# Patient Record
Sex: Male | Born: 1950 | Race: White | Hispanic: No | Marital: Married | Smoking: Never smoker
Health system: Southern US, Community
[De-identification: ages and names within clinical notes are randomized; demographics above are authoritative.]

## PROBLEM LIST (undated history)

## (undated) DIAGNOSIS — M1990 Unspecified osteoarthritis, unspecified site: Secondary | ICD-10-CM

## (undated) DIAGNOSIS — Z87442 Personal history of urinary calculi: Secondary | ICD-10-CM

## (undated) DIAGNOSIS — E785 Hyperlipidemia, unspecified: Secondary | ICD-10-CM

## (undated) DIAGNOSIS — Z8673 Personal history of transient ischemic attack (TIA), and cerebral infarction without residual deficits: Secondary | ICD-10-CM

## (undated) HISTORY — PX: KNEE SURGERY: SHX244

## (undated) HISTORY — DX: Personal history of urinary calculi: Z87.442

## (undated) HISTORY — DX: Personal history of transient ischemic attack (TIA), and cerebral infarction without residual deficits: Z86.73

## (undated) HISTORY — DX: Hyperlipidemia, unspecified: E78.5

## (undated) HISTORY — DX: Unspecified osteoarthritis, unspecified site: M19.90

## (undated) HISTORY — PX: ORTHOPEDIC SURGERY: SHX850

---

## 1997-11-07 ENCOUNTER — Ambulatory Visit (HOSPITAL_BASED_OUTPATIENT_CLINIC_OR_DEPARTMENT_OTHER): Admission: RE | Admit: 1997-11-07 | Discharge: 1997-11-07 | Payer: Self-pay | Admitting: General Surgery

## 1997-11-19 ENCOUNTER — Emergency Department (HOSPITAL_COMMUNITY): Admission: EM | Admit: 1997-11-19 | Discharge: 1997-11-19 | Payer: Self-pay | Admitting: Emergency Medicine

## 1997-12-19 ENCOUNTER — Emergency Department (HOSPITAL_COMMUNITY): Admission: EM | Admit: 1997-12-19 | Discharge: 1997-12-19 | Payer: Self-pay | Admitting: Emergency Medicine

## 1998-08-25 ENCOUNTER — Emergency Department (HOSPITAL_COMMUNITY): Admission: EM | Admit: 1998-08-25 | Discharge: 1998-08-25 | Payer: Self-pay | Admitting: *Deleted

## 1998-08-26 ENCOUNTER — Emergency Department (HOSPITAL_COMMUNITY): Admission: EM | Admit: 1998-08-26 | Discharge: 1998-08-26 | Payer: Self-pay | Admitting: *Deleted

## 1998-08-26 ENCOUNTER — Encounter: Payer: Self-pay | Admitting: *Deleted

## 1998-12-26 ENCOUNTER — Ambulatory Visit (HOSPITAL_BASED_OUTPATIENT_CLINIC_OR_DEPARTMENT_OTHER): Admission: RE | Admit: 1998-12-26 | Discharge: 1998-12-26 | Payer: Self-pay | Admitting: Orthopedic Surgery

## 1999-07-08 HISTORY — PX: LEFT HEART CATH: SHX5946

## 1999-07-11 ENCOUNTER — Ambulatory Visit (HOSPITAL_BASED_OUTPATIENT_CLINIC_OR_DEPARTMENT_OTHER): Admission: RE | Admit: 1999-07-11 | Discharge: 1999-07-11 | Payer: Self-pay | Admitting: Orthopedic Surgery

## 2000-01-16 ENCOUNTER — Encounter: Payer: Self-pay | Admitting: Emergency Medicine

## 2000-01-16 ENCOUNTER — Observation Stay (HOSPITAL_COMMUNITY): Admission: EM | Admit: 2000-01-16 | Discharge: 2000-01-17 | Payer: Self-pay | Admitting: Emergency Medicine

## 2004-11-26 ENCOUNTER — Encounter: Admission: RE | Admit: 2004-11-26 | Discharge: 2004-11-26 | Payer: Self-pay

## 2009-07-07 DIAGNOSIS — Z8673 Personal history of transient ischemic attack (TIA), and cerebral infarction without residual deficits: Secondary | ICD-10-CM

## 2009-07-07 HISTORY — DX: Personal history of transient ischemic attack (TIA), and cerebral infarction without residual deficits: Z86.73

## 2009-08-02 ENCOUNTER — Encounter (INDEPENDENT_AMBULATORY_CARE_PROVIDER_SITE_OTHER): Payer: Self-pay | Admitting: *Deleted

## 2009-08-03 ENCOUNTER — Encounter: Admission: RE | Admit: 2009-08-03 | Discharge: 2009-08-03 | Payer: Self-pay | Admitting: Family Medicine

## 2009-08-14 ENCOUNTER — Encounter (INDEPENDENT_AMBULATORY_CARE_PROVIDER_SITE_OTHER): Payer: Self-pay | Admitting: *Deleted

## 2009-08-15 ENCOUNTER — Ambulatory Visit: Payer: Self-pay | Admitting: Gastroenterology

## 2009-09-07 ENCOUNTER — Telehealth: Payer: Self-pay | Admitting: Gastroenterology

## 2009-10-11 ENCOUNTER — Ambulatory Visit: Payer: Self-pay | Admitting: Gastroenterology

## 2009-10-17 ENCOUNTER — Telehealth: Payer: Self-pay | Admitting: Gastroenterology

## 2009-10-25 ENCOUNTER — Ambulatory Visit (HOSPITAL_COMMUNITY): Admission: RE | Admit: 2009-10-25 | Discharge: 2009-10-25 | Payer: Self-pay | Admitting: Family Medicine

## 2010-07-12 ENCOUNTER — Ambulatory Visit (HOSPITAL_COMMUNITY)
Admission: RE | Admit: 2010-07-12 | Discharge: 2010-07-12 | Payer: Self-pay | Source: Home / Self Care | Attending: Family Medicine | Admitting: Family Medicine

## 2010-07-28 ENCOUNTER — Encounter: Payer: Self-pay | Admitting: Family Medicine

## 2010-08-08 NOTE — Letter (Signed)
Summary: [facility] Instructions  [facility]  [address] [place], [place] [address]   Phone: [phone]  Fax: [phone]       Phillip Jackson    Nov 24, 61 1952    MRN: [id number]        Procedure Day /Date: 09/10/09  Wednesday     Arrival Time:  10:30am      Procedure Time: 11:30am     Location of Procedure:                    _x _  [facility] (4th Floor)                        PREPARATION FOR COLONOSCOPY WITH MOVIPREP   Starting 5 days prior to your procedure 09/05/09 do not eat nuts, seeds, popcorn, corn, beans, peas,  salads, or any raw vegetables.  Do not take any fiber supplements (e.g. Metamucil, Citrucel, and Benefiber).  THE DAY BEFORE YOUR PROCEDURE         DATE:  09/09/09   DAY:  Sunday  1.  Drink clear liquids the entire day-NO SOLID FOOD  2.  Do not drink anything colored red or purple.  Avoid juices with pulp.  No orange juice.  3.  Drink at least 64 oz. (8 glasses) of fluid/clear liquids during the day to prevent dehydration and help the prep work efficiently.  CLEAR LIQUIDS INCLUDE: Water Jello Ice Popsicles Tea (sugar ok, no milk/cream) Powdered fruit flavored drinks Coffee (sugar ok, no milk/cream) Gatorade Juice: apple, white grape, white cranberry  Lemonade Clear bullion, consomm, broth Carbonated beverages (any kind) Strained chicken noodle soup Hard Candy                             4.  In the morning, mix first dose of MoviPrep solution:    Empty 1 Pouch A and 1 Pouch B into the disposable container    Add lukewarm drinking water to the top line of the container. Mix to dissolve    Refrigerate (mixed solution should be used within 24 hrs)  5.  Begin drinking the prep at 5:00 p.m. The MoviPrep container is divided by 4 marks.   Every 15 minutes drink the solution down to the next mark (approximately 8 oz) until the full liter is complete.   6.  Follow completed prep with 16 oz of clear liquid of your choice  (Nothing red or purple).  Continue to drink clear liquids until bedtime.  7.  Before going to bed, mix second dose of MoviPrep solution:    Empty 1 Pouch A and 1 Pouch B into the disposable container    Add lukewarm drinking water to the top line of the container. Mix to dissolve    Refrigerate  THE DAY OF YOUR PROCEDURE      DATE:  09/10/09  DAY:  Monday  Beginning at  6:30 a.m. (5 hours before procedure):         1. Every 15 minutes, drink the solution down to the next mark (approx 8 oz) until the full liter is complete.  2. Follow completed prep with 16 oz. of clear liquid of your choice.    3. You may drink clear liquids until  9:30am (2 HOURS BEFORE PROCEDURE).   MEDICATION INSTRUCTIONS  Unless otherwise instructed, you should take regular prescription medications with a small sip of water  as early as possible the morning of your procedure.         OTHER INSTRUCTIONS  You will need a responsible adult at least 61 years of age to accompany you and drive you home.   This person must remain in the waiting room during your procedure.  Wear loose fitting clothing that is easily removed.  Leave jewelry and other valuables at home.  However, you may wish to bring a book to read or  an iPod/MP3 player to listen to music as you wait for your procedure to start.  Remove all body piercing jewelry and leave at home.  Total time from sign-in until discharge is approximately 2-3 hours.  You should go home directly after your procedure and rest.  You can resume normal activities the  day after your procedure.  The day of your procedure you should not:   Drive   Make legal decisions   Operate machinery   Drink alcohol   Return to work  You will receive specific instructions about eating, activities and medications before you leave.    The above instructions have been reviewed and explained to me by  Wyona Almas RN  August 15, 2009 8:34 AM     I fully  understand and can verbalize these instructions _____________________________ Date _________

## 2010-08-08 NOTE — Letter (Signed)
Summary: Previsit letter  [facility]  [address] [place], [place] [address]   Phone: [phone]  Fax: [phone]       08/02/2009 MRN: [id number]  Phillip Jackson [address] [place], [place]  [address]  Dear Mr. HELZER,  Welcome to the Gastroenterology Division at [facility].    You are scheduled to see a nurse for your pre-procedure visit on 08-15-09 at 8:00a.m. on the 3rd floor at [facility], [address]  We ask that you try to arrive at our office 15 minutes prior to your appointment time to allow for check-in.  Your nurse visit will consist of discussing your medical and surgical history, your immediate family medical history, and your medications.    Please bring a complete list of all your medications or, if you prefer, bring the medication bottles and we will list them.  We will need to be aware of both prescribed and over the counter drugs.  We will need to know exact dosage information as well.  If you are on blood thinners (Coumadin, Plavix, Aggrenox, Ticlid, etc.) please call our office today/prior to your appointment, as we need to consult with your physician about holding your medication.   Please be prepared to read and sign documents such as consent forms, a financial agreement, and acknowledgement forms.  If necessary, and with your consent, a friend or relative is welcome to sit-in on the nurse visit with you.  Please bring your insurance card so that we may make a copy of it.  If your insurance requires a referral to see a specialist, please bring your referral form from your primary care physician.  No co-pay is required for this nurse visit.     If you cannot keep your appointment, please call [phone] to cancel or reschedule prior to your appointment date.  This allows [place] the opportunity to schedule an appointment for another patient in need of care.    Thank you for choosing [facility] for your medical  needs.  We appreciate the opportunity to care for you.  Please visit [place] at our website  to learn more about our practice.                     Sincerely.                                                                                                                   The Gastroenterology Division

## 2010-08-08 NOTE — Miscellaneous (Signed)
Summary: [facility] Previsit/prep  Clinical Lists Changes  Medications: Added new medication of MOVIPREP 100 GM  SOLR (PEG-KCL-NACL-NASULF-NA ASC-C) As per prep instructions. - Signed Rx of MOVIPREP 100 GM  SOLR (PEG-KCL-NACL-NASULF-NA ASC-C) As per prep instructions.;  #1 x 0;  Signed;  Entered by: Wyona Almas RN;  Authorized by: Louis Meckel MD;  Method used: Electronically to [facility] [phone]*, [address], [place], [place]  [address], Ph: [phone] or [phone], Fax: [phone] Allergies: Added new allergy or adverse reaction of CODEINE Observations: Added new observation of NKA: F (08/15/2009 7:58)    Prescriptions: MOVIPREP 100 GM  SOLR (PEG-KCL-NACL-NASULF-NA ASC-C) As per prep instructions.  #1 x 0   Entered by:   Wyona Almas RN   Authorized by:   Louis Meckel MD   Signed by:   Wyona Almas RN on 08/15/2009   Method used:   Electronically to        [facility] [address]* (retail)       [address] [place], [place]  [address]       Ph: [phone] or [phone]       Fax: [phone]   RxID:   [id number]

## 2010-08-08 NOTE — Progress Notes (Signed)
Summary: NS Fee  ---- Converted from flag ---- ---- 10/11/2009 3:30 PM, Alden Hipp wrote: Pt N/S 4.7.11 appt-alp ------------------------------  Phone Note Outgoing Call Call back at [facility] Phone [phone]   Summary of Call: Bill Patient. Initial call taken by: Zackery Barefoot,  October 17, 2009 9:20 AM  Follow-up for Phone Call        Patient Billed. Follow-up by: Leanor Kail [place],  October 19, 2009 1:32 PM

## 2010-08-08 NOTE — Progress Notes (Signed)
Summary: Rescheduling. 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 his orginal procedure was 09/10/2009 at 11:30am, he would like Dr. Arlyce Dice to consider not charging him for resching his prochedure.  Initial call taken by: Harlow Mares CMA Duncan Dull),  September 07, 2009 4:31 PM  Follow-up for Phone Call        ok no charge  Follow-up by: Louis Meckel MD,  September 10, 2009 10:03 AM

## 2010-11-22 NOTE — H&P (Signed)
[place]  Patient:    Phillip Jackson, Phillip Jackson                    MRN: [id number] Adm. Date:  [id number] Attending:  Norman Clay CC:         Sheppard Penton. Stacie Acres, M.D.             Darci Needle, M.D.                         History and Physical  CHIEF COMPLAINT: Chest pain.  HISTORY OF PRESENT ILLNESS: The patient is a 60 year old male who is admitted to rule out unstable angina pectoris.  The patient has previously been in excellent physical health without any medical problems.  He has been under increased situational stress and had some vague chest discomfort over the past several weeks.  He today had developed onset of mid sternal chest discomfort described as a strap around his chest which had been present intermittently over the past couple of days.  He had some difficulty breathing this morning and noted the tight feeling and a strap-like feeling around his chest.  He had an EMT at work suggest that he needed some evaluation.  He went out and ate lunch after this and again had difficulty breathing with tightness in his chest, and came to the emergency room.  He was given nitroglycerin, which resulted in relief of his symptoms.  He was placed on heparin and IV nitroglycerin, and is currently pain-free.  PAST MEDICAL HISTORY: Negative for hypertension.  He does not know what his cholesterol was previously.  He describes himself as being high-strung.  PAST SURGICAL HISTORY:  1. Multiple knee surgeries.  2. Several orthopedic surgeries.  ALLERGIES: No known drug allergies.  CURRENT MEDICATIONS: None.  FAMILY HISTORY: Father had MI at age 12 and later had open heart surgery, and died at age 41.  Mother is living and in good health.  He has two brothers who are in good health.  He has several uncles who have had heart disease.  SOCIAL HISTORY: He is a native of [address] and worked there until he moved here around ten years  ago.  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  He is currently working as a Transport planner for [employer].  He is a nonsmoker and does not use alcohol to excess.  No illicit drugs.  REVIEW OF SYSTEMS: Several orthopedic problems.  No claudication, TIAs, and no other serious medical illnesses.  The remainder of the Review Of Systems was unremarkable except as noted above.  PHYSICAL EXAMINATION:  GENERAL: He is a pleasant male who appears in no acute distress.  VITAL SIGNS: Blood pressure 130/70 sitting and standing, pulse 76.  SKIN: Warm and dry.  HEENT: Normal.  Funduscopic examination normal.  Pharynx negative.  NECK: Supple without mass, thyromegaly, or carotid bruits.  LUNGS: Clear to auscultation and percussion.  CARDIOVASCULAR: Normal S1 and S2.  No S3, S4, or murmur.  ABDOMEN: Soft, nontender.  No mass or organomegaly.  EXTREMITIES: Femoral and distal pulses 2+.  No edema noted.  LABORATORY DATA: A 12 lead ECG shows left axis deviation, no acute changes. Chest x-ray was unremarkable.  Laboratory studies were normal.  IMPRESSION: Chest pain suggestive of unstable angina pectoris, clinically pain-free at the present time.  PLAN: Admit and begin heparin and  IV nitroglycerin and Lopressor 25 mg b.i.d. Dr. Verdis Prime was requested as preference for cardiologist and will ask Dr. Katrinka Blazing to see the patient in the morning.  We discussed tentatively having cardiac catheterization with the patient and he understands and is agreeable to proceeding. DD:  01/16/00 TD:  01/17/00 Job: [id number]

## 2010-11-22 NOTE — Op Note (Signed)
[place]  Patient:    Phillip Jackson                     MRN: [id number] Proc. Date: 07/11/99 Adm. Date:  [id number] Attending:  Colbert Ewing                           Operative Report  PREOPERATIVE DIAGNOSIS:  Degenerative medial meniscus tear, left knee.  POSTOPERATIVE DIAGNOSIS:  Left knee degenerative tearing lateral meniscus. 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  OPERATION:  Left knee exam under anesthesia, arthroscopy, chondroplasty of patella and medial femoral condyle.  Partial lateral meniscectomy.  Partial synovectomy.  SURGEON:  Loreta Ave, M.D.  ASSISTANT:  Arlys John D. Petrarca, P.A.-C.  ANESTHESIA:  General.  ESTIMATED BLOOD LOSS:  Minimal.  SPECIMENS:  None.  CULTURES:  None.  COMPLICATIONS:  None.  DRESSINGS:  Soft compressive.  OPERATIVE FINDINGS:  Full motion, stable knee at arthroscopy.  Good patellofemoral tracking but some focal grade 3 chondromalacia lateral patellar facet.  Easily debrided.  Medial compartment had extensive grade 3 changes over the entire weightbearing dome, but only partial thickness.  Debrided to a stable surface removing old flaps.  Loose fragments of articular cartilage removed from within the knee.  The lateral meniscus had a displaced flap tear off the posterior ___________ saucerized out, tapered into the remaining meniscus salvaging most f the meniscus except for the posterior third.  Cruciate ligaments intact. Lateral meniscus noted degenerative changes.  Reactive synovitis throughout the knee and a small feathered osteochondral loose body anterior and lateral meniscus, easily removed.  DESCRIPTION OF PROCEDURE:  The patient was brought to the operating room and placed on the operating table in supine position.  After adequate anesthesia had been obtained, examination with findings  described above.  Tourniquet and leg holder  were applied.  The leg prepped and draped in the usual sterile fashion.  Three portals created, one superolateral, one each medial and lateral parapatellar. Inflow catheter introduced in either side, the arthroscope introduced, the knee  inspected in serial manner with findings described above.  Menisci saucerized out with baskets, tapered in smoothly with a shaver.  Uneven articular cartilage debrided with the shaver with chondroplasty.  Hypertrophy synovitis removed with a shaver as well.  Small osteochondral loose body removed with the grasping forceps and tapered in smoothly with a shaver.  The entire knee examined.  No other significant findings appreciated.  Hemostasis obtained with electrocautery for he areas of synovectomy.  Instruments and fluid removed.  Portals in knee injected  with Marcaine.  Portals closed with 4-0 nylon.  Sterile compressive dressing applied, anesthesia reversed, brought to the recovery room.  Tolerated surgery ith no complications. DD:  07/10/98 TD:  07/12/99 Job: [id number]

## 2010-11-22 NOTE — Cardiovascular Report (Signed)
[place]  Patient:    Phillip Jackson, Phillip Jackson                    MRN: [id number] Proc. Date: 01/17/00 Adm. Date:  [id number] Attending:  Norman Clay CC:         Francisca December, M.D.             Cardiac Catheterization Laboratory                        Cardiac Catheterization  PROCEDURES PERFORMED: 1. Left heart catheterization. 2. Coronary angiography. 3. Left ventriculogram. 5. Perclose of the right femoral artery.  INDICATIONS:  Mr. [name] is a 60 year old man who experienced chest tightness lasting approximately 12 hours (relieved by sublingual nitroglycerin) yesterday morning and afternoon.   He has subsequently ruled out for myocardial infarction by serial CK-MB and Troponin enzymes, as well as his ECG has remained essentially normal.  He has a strong family history of coronary disease.  He is brought to the catheterization laboratory at this time to rule out the possibility of CAD as an etiology.  PROCEDURAL NOTE:  Left heart catheterization was performed following the percutaneous insertion of a 6-French catheter sheath, utilizing an anterior approach over a guiding J-wire into the right femoral artery.  A 110 cm pigtail catheter was used to measure pressures in the ascending aorta and in the left ventricle, as well as to perform left ventriculography.  Then 6-French #4 left and right Judkins catheters were used to perform coronary angiography.  All catheter manipulations were performed using fluoroscopic observation and exchanges performed over a long guiding J-wire.  At completion of the procedure, the catheter sheath was removed and hemostasis was achieved by use of the Perclose system.  The patient was transported to the recovery area in stable condition.  HEMODYNAMICS: 1. Systemic arterial pressure:  Normal 118/34; mean 93 mmHg. 2. Left ventricular end-diastolic pressure:  14 mmHg pre and    post-ventriculogram.   There was no systolic gradient across the    aortic valve.  ANGIOGRAPHY:  VENTRICULOGRAM:  Demonstrated normal chamber size and normal global systolic function.  The estimated ejection fraction is 65%.  Ventricular ectopy prevented assessment of LVEF by single-plane cineangiographic method.  There was no mitral regurgitation.  CORONARY FINDINGS:  There was no coronary calcification seen.  There was a right dominant coronary system present.  The main left coronary was normal. 1. LEFT MAIN CORONARY:  Had a distal 20% stenosis. 2. LEFT ANTERIOR DESCENDING ARTERY (and its branches):  Were normal.  There    were no significant obstructions or even luminal irregularities within    this vessel.  Two diagonal vessels arise, the first of which is large. 3. CIRCUMFLEX CORONARY ARTERY:  The left circumflex artery and its branches    are also normal.  Again, there are no significant obstructions and no    luminal irregularities seen.  The vessel gives rise to a very small    first marginal branch; second and third marginal branches are larger and    on the obtuse margin. 4. RIGHT CORONARY ARTERY (and its branches):  Again, without significant    obstruction.  Again, no luminal irregularities are seen as well.  The    vessel gives rise to a moderate-sized posterior descending artery, as    well as a large posterolateral segment and branch.  Collateral vessels    are not seen.  FINAL DIAGNOSES: 1. Intact left ventricular size and global systolic function. 2. Normal coronary arteries. 3. Probable GERD as an etiology for his chest discomfort.  PLAN:  The patient will be discharged following brief observation stay in the [facility].  Will begin Protonix 40 mg p.o. q.d. for at least one to two months as treatment for his presumed diagnosis. DD:  01/17/00 TD:  01/17/00 Job: [id number]

## 2013-06-27 ENCOUNTER — Encounter: Payer: Self-pay | Admitting: *Deleted

## 2013-06-28 ENCOUNTER — Encounter: Payer: Self-pay | Admitting: Interventional Cardiology

## 2013-06-28 ENCOUNTER — Ambulatory Visit (INDEPENDENT_AMBULATORY_CARE_PROVIDER_SITE_OTHER): Payer: BC Managed Care – PPO | Admitting: Interventional Cardiology

## 2013-06-28 ENCOUNTER — Encounter (INDEPENDENT_AMBULATORY_CARE_PROVIDER_SITE_OTHER): Payer: Self-pay

## 2013-06-28 VITALS — BP 126/80 | HR 65 | Ht 70.0 in | Wt 209.0 lb

## 2013-06-28 DIAGNOSIS — E785 Hyperlipidemia, unspecified: Secondary | ICD-10-CM

## 2013-06-28 DIAGNOSIS — R079 Chest pain, unspecified: Secondary | ICD-10-CM | POA: Insufficient documentation

## 2013-06-28 DIAGNOSIS — R0789 Other chest pain: Secondary | ICD-10-CM

## 2013-06-28 NOTE — Patient Instructions (Signed)
Your physician recommends that you continue on your current medications as directed. Please refer to the Current Medication list given to you today.  Your physician has requested that you have en exercise stress myoview. For further information please visit [email or web address]. Please follow instruction sheet, as given.  Follow up pending results  

## 2013-06-28 NOTE — Progress Notes (Signed)
Patient ID: Phillip Jackson, male   DOB: 1951-01-18, 62 y.o.   MRN: [id number]   Date: 06/28/2013 ID: Phillip Jackson, DOB Jan 03, 1951, MRN [id number] PCP: No primary provider on file.  Reason: Chest pain  ASSESSMENT;  1. Chest pain of 2 minute duration of quality, location, and radiation compatible with ischemia 2. 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 Hyperlipidemia  PLAN:   1. Stress Cardiolite 2. Aspirin 81 mg per day   SUBJECTIVE: Phillip Jackson is a 62 y.o. male who is referred for evaluation of an episode of left chest discomfort that radiated into the left shoulder arm and into his back. This discomfort occurred while driving his car. There was associated shortness of breath and diaphoresis. There were no palpitations. The patient did not have syncope. I saw him 2 years ago for vague chest complaints and felt that a stress nuclear study needed to be performed. He did not follow through. He has no significant risk factors other than age and sex. He did undergo coronary angiography greater than 10 years ago that revealed no significant blockage at that time.   Allergies  Allergen Reactions  . Codeine     REACTION: hallucinations    No current outpatient prescriptions on file prior to visit.   No current facility-administered medications on file prior to visit.    No past medical history on file.  Past Surgical History  Procedure Laterality Date  . Knee surgery      multiple  . Orthopedic surgery      several  . Left heart cath  2001    History   Social History  . Marital Status: Married    Spouse Name: N/A    Number of Children: N/A  . Years of Education: N/A   Occupational History  . Not on file.   Social History Main Topics  . Smoking status: Never Smoker   . Smokeless tobacco: Not on file  . Alcohol Use: No  . Drug Use: No  . Sexual Activity: Not on file   Other Topics  Concern  . Not on file   Social History Narrative  . No narrative on file    Family History  Problem Relation Age of Onset  . Heart attack Father 39  . Other Father     open heart surgery  . Heart disease      unclesn on both sides of famiily    ROS: He denies claudication. Has not had syncope or palpitations. He denies dyspnea. No transient neurological complaints. He did have an episode of left arm weakness a few years ago for which he did not allow evaluation. Other systems negative for complaints.  OBJECTIVE: BP 126/80  Pulse 65  Ht 5\' 10"  (1.778 m)  Wt 209 lb (94.802 kg)  BMI 29.99 kg/m2,  General: No acute distress, younger than stated age HEENT: normal without pallor or jaundice Neck: JVD flat. Carotids bounding Chest: Clear Cardiac: Murmur: None. Gallop: S4. Rhythm: Regular. Other: Normal Abdomen: Bruit: Absent. Pulsation: Absent Extremities: Edema: Absent. Pulses: 2+ bilateral Neuro: Normal Psych: Ages  ECG: Sinus rhythm with left anterior hemiblock. Unchanged from prior tracing 2012.  Gwynneth Albright, M.D. Past Medical History  HYyperlipidemia   Osteoarthritis   Kidney stone

## 2013-07-19 ENCOUNTER — Encounter: Payer: Self-pay | Admitting: Cardiovascular Disease

## 2013-07-19 ENCOUNTER — Ambulatory Visit (HOSPITAL_COMMUNITY): Payer: BC Managed Care – PPO | Attending: Cardiovascular Disease | Admitting: Radiology

## 2013-07-19 VITALS — BP 148/90 | HR 58 | Ht 70.0 in | Wt 205.0 lb

## 2013-07-19 DIAGNOSIS — R079 Chest pain, unspecified: Secondary | ICD-10-CM | POA: Insufficient documentation

## 2013-07-19 DIAGNOSIS — Z8673 Personal history of transient ischemic attack (TIA), and cerebral infarction without residual deficits: Secondary | ICD-10-CM | POA: Insufficient documentation

## 2013-07-19 DIAGNOSIS — Z8249 Family history of ischemic heart disease and other diseases of the circulatory system: Secondary | ICD-10-CM | POA: Insufficient documentation

## 2013-07-19 DIAGNOSIS — E785 Hyperlipidemia, unspecified: Secondary | ICD-10-CM | POA: Insufficient documentation

## 2013-07-19 MED ORDER — TECHNETIUM TC 99M SESTAMIBI GENERIC - CARDIOLITE
33.0000 | Freq: Once | INTRAVENOUS | Status: AC | PRN
  Administered 2013-07-19: 33 via INTRAVENOUS

## 2013-07-19 MED ORDER — TECHNETIUM TC 99M SESTAMIBI GENERIC - CARDIOLITE
11.0000 | Freq: Once | INTRAVENOUS | Status: AC | PRN
  Administered 2013-07-19: 11 via INTRAVENOUS

## 2013-07-19 NOTE — Progress Notes (Signed)
[facility] SITE 3 NUCLEAR MED [address] [place], [place] [address] [phone]    Cardiology Nuclear Med Study  Phillip Jackson is a 63 y.o. male     MRN : [id number]     DOB: Mar 17, 1951  Procedure Date: 07/19/2013  Nuclear Med Background Indication for Stress Test:  Evaluation for Ischemia History: No prior known history of CAD Cardiac Risk Factors: Family History - CAD, Lipids and TIA  Symptoms:  Chest Pain that radiated to left shoulder and back   Nuclear Pre-Procedure Caffeine/Decaff Intake:  None > 12 hrs NPO After: 8:30pm   Lungs:  clear O2 Sat: 97% on room air. IV 0.9% NS with Angio Cath:  20g  IV Site: R Antecubital x 1, tolerated well IV Started by:  Irean HongPatsy Edwards, RN  Chest Size (in):  44 Cup Size: n/a  Height: 5\' 10"  (1.778 m)  Weight:  205 lb (92.987 kg)  BMI:  Body mass index is 29.41 kg/(m^2). Tech Comments:  N/A    Nuclear Med Study 1 or 2 day study: 1 day  Stress Test Type:  Stress  Reading MD: N/A  Order Authorizing Provider:  Verdis PrimeHenry Smith, III  Resting Radionuclide: Technetium 10171m Sestamibi  Resting Radionuclide Dose: 11.0 mCi   Stress Radionuclide:  Technetium 7371m Sestamibi  Stress Radionuclide Dose: 33.0 mCi           Stress Protocol Rest HR: 58 Stress HR: 146  Rest BP: 148/90 Stress BP: 167/94  Exercise Time (min): 7:00 METS: 8.5           Dose of Adenosine (mg):  n/a Dose of Lexiscan: n/a mg  Dose of Atropine (mg): n/a Dose of Dobutamine: n/a mcg/kg/min (at max HR)  Stress Test Technologist: Nelson ChimesSharon Brooks, BS-ES  Nuclear Technologist:  Domenic PoliteStephen Carbone, CNMT     Rest Procedure:  Myocardial perfusion imaging was performed at rest 45 minutes following the intravenous administration of Technetium 3171m Sestamibi. Rest ECG: Sinus bradycardia; prior septal MI.  Stress Procedure:  The patient exercised on the treadmill utilizing the Bruce Protocol for 7:00 minutes. The patient stopped due to fatigue and denied any chest pain.   Technetium 271m Sestamibi was injected at peak exercise and myocardial perfusion imaging was performed after a brief delay. Stress ECG: No significant ST segment change suggestive of ischemia.  QPS Raw Data Images:  Acquisition technically good; normal left ventricular size. Stress Images:  Normal homogeneous uptake in all areas of the myocardium. Rest Images:  Normal homogeneous uptake in all areas of the myocardium. Subtraction (SDS):  No evidence of ischemia. Transient Ischemic Dilatation (Normal <1.22):  0.97 Lung/Heart Ratio (Normal <0.45):  0.41  Quantitative Gated Spect Images QGS EDV:  119 ml QGS ESV:  59 ml  Impression Exercise Capacity:  Fair exercise capacity. BP Response:  Normal BP response (peak exercise BP ? decreased but immediate post exercise SBP 183). Clinical Symptoms:  No chest pain or dyspnea. ECG Impression:  No significant ST segment change suggestive of ischemia. Comparison with Prior Nuclear Study: No previous nuclear study performed  Overall Impression:  Normal stress nuclear study.  LV Ejection Fraction: 50%.  LV Wall Motion:  NL LV Function; NL Wall Motion  Phillip Jackson

## 2013-07-22 ENCOUNTER — Telehealth: Payer: Self-pay

## 2013-07-22 NOTE — Telephone Encounter (Signed)
lmom.No evidence of blocked arteries of significance. Please let him know that no further w/u needed unless recurrent symptom. I don't think pain was heart related.

## 2013-07-22 NOTE — Telephone Encounter (Signed)
Message copied by Jarvis NewcomerPARRIS-GODLEY, Rodney Wigger S on Fri Jul 22, 2013  2:46 PM ------      Message from: Verdis PrimeSMITH, HENRY      Created: Thu Jul 21, 2013  7:34 AM       No evidence of blocked arteries of significance. Please let him know that no further w/u needed unless recurrent symptom. I don't think pain was heart related. ------

## 2013-08-19 DIAGNOSIS — N200 Calculus of kidney: Secondary | ICD-10-CM | POA: Insufficient documentation

## 2015-09-28 DIAGNOSIS — B356 Tinea cruris: Secondary | ICD-10-CM | POA: Diagnosis not present

## 2015-09-28 DIAGNOSIS — M25561 Pain in right knee: Secondary | ICD-10-CM | POA: Diagnosis not present

## 2015-09-28 DIAGNOSIS — E782 Mixed hyperlipidemia: Secondary | ICD-10-CM | POA: Diagnosis not present

## 2015-11-23 DIAGNOSIS — E782 Mixed hyperlipidemia: Secondary | ICD-10-CM | POA: Diagnosis not present

## 2016-07-12 ENCOUNTER — Other Ambulatory Visit: Payer: Self-pay | Admitting: Sports Medicine

## 2016-07-14 NOTE — Telephone Encounter (Signed)
Rx refill request

## 2016-10-10 ENCOUNTER — Other Ambulatory Visit: Payer: Self-pay | Admitting: Sports Medicine

## 2016-11-21 ENCOUNTER — Other Ambulatory Visit: Payer: Self-pay | Admitting: Sports Medicine

## 2017-02-26 DIAGNOSIS — Z23 Encounter for immunization: Secondary | ICD-10-CM | POA: Diagnosis not present

## 2017-07-31 DIAGNOSIS — R3915 Urgency of urination: Secondary | ICD-10-CM | POA: Diagnosis not present

## 2017-07-31 DIAGNOSIS — N200 Calculus of kidney: Secondary | ICD-10-CM | POA: Diagnosis not present

## 2017-08-26 ENCOUNTER — Ambulatory Visit (INDEPENDENT_AMBULATORY_CARE_PROVIDER_SITE_OTHER): Payer: Medicare Other | Admitting: Physician Assistant

## 2017-08-26 ENCOUNTER — Ambulatory Visit (INDEPENDENT_AMBULATORY_CARE_PROVIDER_SITE_OTHER): Payer: Medicare Other

## 2017-08-26 ENCOUNTER — Encounter (INDEPENDENT_AMBULATORY_CARE_PROVIDER_SITE_OTHER): Payer: Self-pay | Admitting: Physician Assistant

## 2017-08-26 DIAGNOSIS — M25511 Pain in right shoulder: Secondary | ICD-10-CM

## 2017-08-26 DIAGNOSIS — M7581 Other shoulder lesions, right shoulder: Secondary | ICD-10-CM | POA: Diagnosis not present

## 2017-08-26 DIAGNOSIS — M25512 Pain in left shoulder: Secondary | ICD-10-CM | POA: Diagnosis not present

## 2017-08-26 DIAGNOSIS — G8929 Other chronic pain: Secondary | ICD-10-CM | POA: Diagnosis not present

## 2017-08-26 MED ORDER — LIDOCAINE HCL 2 % IJ SOLN
2.0000 mL | INTRAMUSCULAR | Status: AC | PRN
  Administered 2017-08-26: 2 mL

## 2017-08-26 MED ORDER — METHYLPREDNISOLONE ACETATE 40 MG/ML IJ SUSP
40.0000 mg | INTRAMUSCULAR | Status: AC | PRN
  Administered 2017-08-26: 40 mg via INTRA_ARTICULAR

## 2017-08-26 MED ORDER — BUPIVACAINE HCL 0.25 % IJ SOLN
2.0000 mL | INTRAMUSCULAR | Status: AC | PRN
  Administered 2017-08-26: 2 mL via INTRA_ARTICULAR

## 2017-08-26 NOTE — Progress Notes (Signed)
Office Visit Note   Patient: Phillip Jackson           Date of Birth: Dec 27, 1950           MRN: [id number] Visit Date: 08/26/2017              Requested by: No referring provider defined for this encounter. PCP: Patient, [name]   Assessment & Plan: Visit Diagnoses:  1. Chronic pain of both shoulders   2. Rotator cuff tendinitis, right     Plan: Impression is bilateral shoulder rotator cuff tendinitis right greater than left.  Today, we injected Phillips right shoulder subacromially with cortisone.  He is encouraged to rest ice and elevate as much possible today and tomorrow.  He will started Job exercise program next week.  If he is not any better in the next 2-3 weeks we will likely repeat the MRI of his right shoulder.  He will call with concerns or questions in the meantime.  Follow-Up Instructions: Return if symptoms worsen or fail to improve.   Orders:  Orders Placed This Encounter  Procedures  . Large Joint Inj: R subacromial bursa  . XR Shoulder Left  . XR Shoulder Right   No orders of the defined types were placed in this encounter.     Procedures: Large Joint Inj: R subacromial bursa on 08/26/2017 10:09 AM Indications: pain Details: 22 G needle Medications: 2 mL lidocaine 2 %; 2 mL bupivacaine 0.25 %; 40 mg methylPREDNISolone acetate 40 MG/ML Outcome: tolerated well, no immediate complications Patient was prepped and draped in the usual sterile fashion.       Clinical Data: No additional findings.   Subjective: Chief Complaint  Patient presents with  . Left Shoulder - Pain  . Right Shoulder - Pain    HPI Phillip Jackson is a pleasant 67 year old gentleman who presents our clinic today with recurrent bilateral shoulder pain, right greater than left.  This initially began several years ago without any known injury or change in activity.  He was seen by Dr. Prince RomeHilts for this where an MRI of the right shoulder was obtained.  He states that this showed a small  tear but surgical intervention was not warranted at that point.  No previous cortisone injection.  The symptoms did resolve over period of time.  Over the past 3-4 months the symptoms have returned.  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  All his pain is to the anterior aspect of the shoulder occasionally radiating into the deltoid.  He only has pain however when he sleeps on the right side or with abduction and forward flexion of the forearm.  No weakness.  He does not take any medications for this.  No numbness tingling burning.  Review of Systems as detailed in HPI.  All others reviewed and are negative.   Objective: Vital Signs: There were no vitals taken for this visit.  Physical Exam well-developed well-nourished gentleman in no acute distress.  Alert and oriented x3.  Ortho Exam examination of both shoulders reveal full active range of motion without pain.  He can internally rotate to T12.  Right shoulder has markedly positive empty can.  Negative drop arm both sides.  He is rest intact distally.  Specialty Comments:  No specialty comments available.  Imaging: Xr Shoulder Left  Result Date: 08/26/2017 X-rays of the left shoulder are negative for structural abnormalities  Xr Shoulder Right  Result Date: 08/26/2017 X-rays of the right shoulder are negative for structural abnormalities    PMFS History: Patient Active Problem List   Diagnosis Date Noted  . Chest discomfort 06/28/2013  . Chest pain 06/28/2013  . Hyperlipidemia 06/28/2013   Past Medical History:  Diagnosis Date  . History of kidney stones   . HLD (hyperlipidemia)   . 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 OA (osteoarthritis)     Family History  Problem Relation Age of Onset  . Heart attack Father 19  . Other Father        open heart surgery  . Heart disease Unknown         uncles on both sides of famiily    Past Surgical History:  Procedure Laterality Date  . KNEE SURGERY     multiple  . LEFT HEART CATH  2001  . ORTHOPEDIC SURGERY     several   Social History   Occupational History  . Not on file  Tobacco Use  . Smoking status: Never Smoker  . Smokeless tobacco: Never Used  Substance and Sexual Activity  . Alcohol use: No  . Drug use: No  . Sexual activity: Not on file

## 2017-09-02 DIAGNOSIS — K5730 Diverticulosis of large intestine without perforation or abscess without bleeding: Secondary | ICD-10-CM | POA: Diagnosis not present

## 2017-09-02 DIAGNOSIS — R9341 Abnormal radiologic findings on diagnostic imaging of renal pelvis, ureter, or bladder: Secondary | ICD-10-CM | POA: Diagnosis not present

## 2017-09-02 DIAGNOSIS — N200 Calculus of kidney: Secondary | ICD-10-CM | POA: Diagnosis not present

## 2017-09-02 DIAGNOSIS — R1084 Generalized abdominal pain: Secondary | ICD-10-CM | POA: Diagnosis not present

## 2017-11-20 DIAGNOSIS — L821 Other seborrheic keratosis: Secondary | ICD-10-CM | POA: Diagnosis not present

## 2017-11-20 DIAGNOSIS — B078 Other viral warts: Secondary | ICD-10-CM | POA: Diagnosis not present

## 2017-11-20 DIAGNOSIS — Z789 Other specified health status: Secondary | ICD-10-CM | POA: Diagnosis not present

## 2017-11-20 DIAGNOSIS — R209 Unspecified disturbances of skin sensation: Secondary | ICD-10-CM | POA: Diagnosis not present

## 2017-11-20 DIAGNOSIS — L918 Other hypertrophic disorders of the skin: Secondary | ICD-10-CM | POA: Diagnosis not present

## 2017-11-20 DIAGNOSIS — R238 Other skin changes: Secondary | ICD-10-CM | POA: Diagnosis not present

## 2017-11-20 DIAGNOSIS — L820 Inflamed seborrheic keratosis: Secondary | ICD-10-CM | POA: Diagnosis not present

## 2017-12-03 ENCOUNTER — Encounter (INDEPENDENT_AMBULATORY_CARE_PROVIDER_SITE_OTHER): Payer: Self-pay | Admitting: Orthopedic Surgery

## 2017-12-03 ENCOUNTER — Ambulatory Visit (INDEPENDENT_AMBULATORY_CARE_PROVIDER_SITE_OTHER): Payer: Medicare Other

## 2017-12-03 ENCOUNTER — Ambulatory Visit (INDEPENDENT_AMBULATORY_CARE_PROVIDER_SITE_OTHER): Payer: Medicare Other | Admitting: Orthopedic Surgery

## 2017-12-03 DIAGNOSIS — M25572 Pain in left ankle and joints of left foot: Secondary | ICD-10-CM

## 2017-12-05 ENCOUNTER — Encounter (INDEPENDENT_AMBULATORY_CARE_PROVIDER_SITE_OTHER): Payer: Self-pay | Admitting: Orthopedic Surgery

## 2017-12-05 NOTE — Progress Notes (Signed)
Office Visit Note   Patient: Phillip Jackson           Date of Birth: 01-27-1951           MRN: [id number] Visit Date: 12/03/2017 Requested by: No referring provider defined for this encounter. PCP: Patient, [name]  Subjective: Chief Complaint  Patient presents with  . 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  He describes numbness discretely in the superficial peroneal nerve distribution just below the malleolus on the dorsal aspect of the foot.  Does have a history of gout and reports swelling in the sinus Tarsi region.  He does take allopurinol and colchicine regularly.  [place] and saw some of his friends.  Dietary indulgences were taken and he has subsequently had this pain and numbness.  Denies any history of injury.  Denies any low back pain.  He does do a lot of activity as a Psychologist, occupationalcoach of a college baseball team and this does include running.              ROS: All systems reviewed are negative as they relate to the chief complaint within the history of present illness.  Patient denies  fevers or chills.   Assessment & Plan: Visit Diagnoses:  1. Pain in left ankle and joints of left foot     Plan: Impression is left foot and ankle pain.  This is numbness which could be related to gout in the tib-fib joint, some type of exertional compartment syndrome, compression of the nerve at its exit point in the lower leg fascia, or compression of the nerve in the sinus Tarsi region from gout flare.  At this time I do not really see any evidence of acute ongoing gout or swelling.  No motor weakness to dorsiflexion in that left foot.  I would wait this out a month and then consider nerve conduction studies to localize the site of compression.  We will see him back in 4 weeks for clinical recheck and decision for or against nerve conduction study.  Follow-Up Instructions: Return in about 1 month  (around 12/31/2017).   Orders:  Orders Placed This Encounter  Procedures  . XR Foot Complete Left   No orders of the defined types were placed in this encounter.     Procedures: No procedures performed   Clinical Data: No additional findings.  Objective: Vital Signs: There were no vitals taken for this visit.  Physical Exam:   Constitutional: Patient appears well-developed HEENT:  Head: Normocephalic Eyes:EOM are normal Neck: Normal range of motion Cardiovascular: Normal rate Pulmonary/chest: Effort normal Neurologic: Patient is alert Skin: Skin is warm Psychiatric: Patient has normal mood and affect    Ortho Exam: Orthopedic exam demonstrates normal gait and alignment.  Patient has trace left knee effusion.  Negative Tinel's both at the fibular head as well as 9 cm above the lateral malleolus.  Patient does have paresthesias in the superficial peroneal nerve distribution dorsal foot on the left but not the right.  Pedal pulses palpable.  Compartments are soft.  Ankle dorsiflexion bilaterally 5+ out of 5.  Ankle eversion 5+ out of 5.  No tenderness to palpation along the metatarsal shaft.  Specialty Comments:  No specialty comments available.  Imaging: No results found.   PMFS History: Patient Active Problem List   Diagnosis Date Noted  .  Chest discomfort 06/28/2013  . Chest pain 06/28/2013  . Hyperlipidemia 06/28/2013   Past Medical History:  Diagnosis Date  . History of kidney stones   . HLD (hyperlipidemia)   . 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 OA (osteoarthritis)     Family History  Problem Relation Age of Onset  . Heart attack Father 63  . Other Father        open heart surgery  . Heart disease Unknown        uncles on both sides of famiily    Past Surgical History:  Procedure Laterality Date  . KNEE SURGERY     multiple  . LEFT HEART CATH  2001  . ORTHOPEDIC SURGERY     several   Social  History   Occupational History  . Not on file  Tobacco Use  . Smoking status: Never Smoker  . Smokeless tobacco: Never Used  Substance and Sexual Activity  . Alcohol use: No  . Drug use: No  . Sexual activity: Not on file

## 2017-12-30 ENCOUNTER — Ambulatory Visit (INDEPENDENT_AMBULATORY_CARE_PROVIDER_SITE_OTHER): Payer: Medicare Other | Admitting: Orthopedic Surgery

## 2017-12-30 ENCOUNTER — Encounter (INDEPENDENT_AMBULATORY_CARE_PROVIDER_SITE_OTHER): Payer: Self-pay | Admitting: Orthopedic Surgery

## 2017-12-30 DIAGNOSIS — M25572 Pain in left ankle and joints of left foot: Secondary | ICD-10-CM | POA: Diagnosis not present

## 2017-12-30 NOTE — Progress Notes (Signed)
Office Visit Note   Patient: Phillip Jackson           Date of Birth: 12/09/1950           MRN: [id number] Visit Date: 12/30/2017 Requested by: No referring provider defined for this encounter. PCP: Patient, [name]  Subjective: Chief Complaint  Patient presents with  . Left Foot - Follow-up    HPI: Ariyan is a patient with left foot and ankle pain.  He states he is doing much better.  He was having proximal pain and tenderness but that has improved.  He does have a history of gout and has been taking some herbal remedies for that along with other antigout medication.  He states that the proximal pain in his left leg is gone.  Does not have really any numbness or tingling in the foot anymore.  Our decision point today was for or against nerve conduction study on that left foot.  He really only localizes a slight amount of pain at the first tarsometatarsal joint.              ROS: All systems reviewed are negative as they relate to the chief complaint within the history of present illness.  Patient denies  fevers or chills.   Assessment & Plan: Visit Diagnoses:  1. Pain in left ankle and joints of left foot     Plan: Impression is improvement in proximal left leg pain which potentially was giving him some peroneal nerve compression.  He has excellent strength today and no real numbness or tingling.  He only has point tenderness at the first tarsometatarsal joint.  May have a cyst or early degenerative changes in that joint.  Does not look like he is having a gout flare in this area.  Has excellent ankle range of motion with no warmth in any joints.  I would await this 1 out for now.  I think he should continue with topical anti-inflammatories to that first MTP joint area if it remains symptomatic.  Follow-up with me as needed.  Follow-Up Instructions: No follow-ups on file.   Orders:  No orders of the defined types were placed in this encounter.  No orders of the defined types  were placed in this encounter.     Procedures: No procedures performed   Clinical Data: No additional findings.  Objective: Vital Signs: There were no vitals taken for this visit.  Physical Exam:   Constitutional: Patient appears well-developed HEENT:  Head: Normocephalic Eyes:EOM are normal Neck: Normal range of motion Cardiovascular: Normal rate Pulmonary/chest: Effort normal Neurologic: Patient is alert Skin: Skin is warm Psychiatric: Patient has normal mood and affect    Ortho Exam: Ortho exam demonstrates palpable pedal pulses.  He has good ankle dorsiflexion plantarflexion inversion eversion strength on the left-hand side.  No warmth to any of the joints.  No pain with pronation supination of the forefoot.  Does have mild tenderness at the first MTP joint but no discrete cystic changes there.  No proximal fibular tenderness.  No definite paresthesias on the dorsal or plantar aspect of the foot  Specialty Comments:  No specialty comments available.  Imaging: No results found.   PMFS History: Patient Active Problem List   Diagnosis Date Noted  . Chest discomfort 06/28/2013  . Chest pain 06/28/2013  . Hyperlipidemia 06/28/2013   Past Medical History:  Diagnosis Date  . History of kidney stones   . HLD (hyperlipidemia)   . Hx-TIA (transient ischemic attack)  2011   right brain with left arm weakness  . OA (osteoarthritis)     Family History  Problem Relation Age of Onset  . Heart attack Father 1342  . Other Father        open heart surgery  . Heart disease Unknown        uncles on both sides of famiily    Past Surgical History:  Procedure Laterality Date  . KNEE SURGERY     multiple  . LEFT HEART CATH  2001  . ORTHOPEDIC SURGERY     several   Social History   Occupational History  . Not on file  Tobacco Use  . Smoking status: Never Smoker  . Smokeless tobacco: Never Used  Substance and Sexual Activity  . Alcohol use: No  . Drug use: No  .  Sexual activity: Not on file

## 2018-02-25 DIAGNOSIS — Z23 Encounter for immunization: Secondary | ICD-10-CM | POA: Diagnosis not present

## 2018-07-14 ENCOUNTER — Encounter (INDEPENDENT_AMBULATORY_CARE_PROVIDER_SITE_OTHER): Payer: Self-pay | Admitting: Family Medicine

## 2018-07-14 ENCOUNTER — Ambulatory Visit (INDEPENDENT_AMBULATORY_CARE_PROVIDER_SITE_OTHER): Payer: Medicare Other | Admitting: Family Medicine

## 2018-07-14 VITALS — BP 136/86 | HR 71 | Temp 99.0°F | Resp 16 | Ht 68.0 in | Wt 208.5 lb

## 2018-07-14 DIAGNOSIS — M1711 Unilateral primary osteoarthritis, right knee: Secondary | ICD-10-CM | POA: Insufficient documentation

## 2018-07-14 DIAGNOSIS — R739 Hyperglycemia, unspecified: Secondary | ICD-10-CM

## 2018-07-14 DIAGNOSIS — E785 Hyperlipidemia, unspecified: Secondary | ICD-10-CM | POA: Diagnosis not present

## 2018-07-14 DIAGNOSIS — R944 Abnormal results of kidney function studies: Secondary | ICD-10-CM

## 2018-07-14 DIAGNOSIS — E559 Vitamin D deficiency, unspecified: Secondary | ICD-10-CM | POA: Diagnosis not present

## 2018-07-14 DIAGNOSIS — R413 Other amnesia: Secondary | ICD-10-CM | POA: Diagnosis not present

## 2018-07-14 DIAGNOSIS — Z0000 Encounter for general adult medical examination without abnormal findings: Secondary | ICD-10-CM | POA: Diagnosis not present

## 2018-07-14 DIAGNOSIS — M1A00X0 Idiopathic chronic gout, unspecified site, without tophus (tophi): Secondary | ICD-10-CM | POA: Diagnosis not present

## 2018-07-14 DIAGNOSIS — Z87442 Personal history of urinary calculi: Secondary | ICD-10-CM | POA: Diagnosis not present

## 2018-07-14 DIAGNOSIS — Z125 Encounter for screening for malignant neoplasm of prostate: Secondary | ICD-10-CM | POA: Diagnosis not present

## 2018-07-14 MED ORDER — ALLOPURINOL 100 MG PO TABS: 100.0000 mg | ORAL_TABLET | Freq: Every day | ORAL | 3 refills | Status: DC

## 2018-07-14 MED ORDER — ALLOPURINOL 100 MG PO TABS: 100.0000 mg | ORAL_TABLET | Freq: Two times a day (BID) | ORAL | 3 refills | Status: DC

## 2018-07-14 MED ORDER — COLCHICINE 0.6 MG PO TABS: 0.6000 mg | ORAL_TABLET | Freq: Every day | ORAL | 3 refills | Status: DC

## 2018-07-14 NOTE — Progress Notes (Signed)
Office Visit Note   Patient: Phillip Jackson           Date of Birth: July 07, 1951           MRN: [id number] Visit Date: 07/14/2018 Requested by: No referring provider defined for this encounter. PCP: Patient, [name]  Subjective: Chief Complaint  Patient presents with  . Medicare Wellness    HPI: He is a 68 year old here for a wellness examination.  He had some abdominal pain last week, left lower abdomen different than his previous kidney stone pains.  Ibuprofen has helped, he takes it intermittently.  Today he is feeling much better, he thinks it is back to normal.  His only concern has been memory loss.  We discussed this the last time I saw him about 5 years ago.  It has not gotten much worse, but it is bothersome to him.  Infrequently forgets names and sometimes forgets what he was going to do.  He is no longer on Crestor.  His only medications are for gout.  He has a history of gout which has been well controlled on allopurinol and colchicine.  He has had nephrolithiasis which also has been under good control lately.  Last year his GFR was slightly elevated probably related to nephrolithiasis.  He is due to have labs checked today.  He has struggled with osteoarthritis in his right knee for many years.  In the past he did well with Visco supplementation and would like to have that again.               ROS: Denies any headaches, visual disturbance, hearing loss, gastrointestinal troubles, chest pain or palpitations, respiratory problems, skin/hair/nail changes, constipation or diarrhea.  Objective: Vital Signs: BP 136/86 (BP Location: Right Arm, Patient Position: Sitting, Cuff Size: Normal)   Pulse 71   Temp 99 F (37.2 C)   Resp 16   Ht 5\' 8"  (1.727 m)   Wt 208 lb 8 oz (94.6 kg)   SpO2 96%   BMI 31.70 kg/m   Physical Exam:  HEENT:  [place]/AT, PERRLA, EOM Full, no nystagmus.  Funduscopic examination within normal limits.  No conjunctival erythema.  Tympanic  membranes are pearly gray with normal landmarks.  External ear canals are normal.  Nasal passages are clear.  Oropharynx is clear.  No significant lymphadenopathy.  No thyromegaly or nodules.  2+ carotid pulses without bruits.  CV: Regular rate and rhythm without murmurs, rubs, or gallops.  No peripheral edema.  2+ radial and posterior tibial pulses.  Lungs: Clear to auscultation throughout with no wheezing or areas of consolidation.  Abd: Bowel sounds are active, no hepatosplenomegaly or masses.  Soft, slight muscular wall tenderness in the left lower quadrant but no definite hernia.    MSK: Right knee shows trace effusion, slight medial joint line tenderness.  Good range of motion.  1+ patellofemoral crepitus.  1+ upper and lower DTRs.     Imaging: None today.  Assessment & Plan: 1.  Wellness examination -Labs today, he is up-to-date from a health maintenance standpoint.  Colonoscopy was normal.  Follow-up in 1 year.  2.  Memory change -Labs to evaluate.  3.  History of nephrolithiasis and gout, currently under control.   Follow-Up Instructions: No follow-ups on file.      Procedures: No procedures performed  No notes on file    PMFS History: Patient Active Problem List   Diagnosis Date Noted  . History of nephrolithiasis 07/14/2018  . Kidney  stones 08/19/2013  . Hyperlipidemia 06/28/2013   Past Medical History:  Diagnosis Date  . History of kidney stones   . HLD (hyperlipidemia)   . 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 OA (osteoarthritis)     Family History  Problem Relation Age of Onset  . Heart attack Father [id number]  . Other Father        open heart surgery  . Diabetes Father   . Heart disease Other        uncles on both sides of famiily  . Hypertension Mother   . Diabetes Brother   . Cancer Neg Hx     Past Surgical History:  Procedure Laterality Date  . KNEE SURGERY     multiple  . LEFT HEART CATH  2001  .  ORTHOPEDIC SURGERY     several   Social History   Occupational History  . Not on file  Tobacco Use  . Smoking status: Never Smoker  . Smokeless tobacco: Never Used  Substance and Sexual Activity  . Alcohol use: No  . Drug use: No  . Sexual activity: Not on file

## 2018-07-16 ENCOUNTER — Telehealth (INDEPENDENT_AMBULATORY_CARE_PROVIDER_SITE_OTHER): Payer: Self-pay | Admitting: Family Medicine

## 2018-07-16 ENCOUNTER — Encounter (INDEPENDENT_AMBULATORY_CARE_PROVIDER_SITE_OTHER): Payer: Medicare Other | Admitting: Family Medicine

## 2018-07-16 ENCOUNTER — Encounter (INDEPENDENT_AMBULATORY_CARE_PROVIDER_SITE_OTHER): Payer: Self-pay | Admitting: Family Medicine

## 2018-07-16 LAB — COMPREHENSIVE METABOLIC PANEL
AG Ratio: 1.4 (calc) (ref 1.0–2.5)
ALBUMIN MSPROF: 4.2 g/dL (ref 3.6–5.1)
ALKALINE PHOSPHATASE (APISO): 67 U/L (ref 40–115)
ALT: 30 U/L (ref 9–46)
AST: 24 U/L (ref 10–35)
BILIRUBIN TOTAL: 0.4 mg/dL (ref 0.2–1.2)
BUN/Creatinine Ratio: 14 (calc) (ref 6–22)
BUN: 20 mg/dL (ref 7–25)
CALCIUM: 9.8 mg/dL (ref 8.6–10.3)
CO2: 23 mmol/L (ref 20–32)
Chloride: 104 mmol/L (ref 98–110)
Creat: 1.47 mg/dL — ABNORMAL HIGH (ref 0.70–1.25)
GLOBULIN: 2.9 g/dL (ref 1.9–3.7)
Glucose, Bld: 80 mg/dL (ref 65–99)
Potassium: 4.6 mmol/L (ref 3.5–5.3)
Sodium: 139 mmol/L (ref 135–146)
Total Protein: 7.1 g/dL (ref 6.1–8.1)

## 2018-07-16 LAB — ANA: Anti Nuclear Antibody(ANA): NEGATIVE

## 2018-07-16 LAB — CBC WITH DIFFERENTIAL/PLATELET
ABSOLUTE MONOCYTES: 833 {cells}/uL (ref 200–950)
BASOS ABS: 112 {cells}/uL (ref 0–200)
Basophils Relative: 1.6 %
EOS PCT: 3.1 %
Eosinophils Absolute: 217 cells/uL (ref 15–500)
HEMATOCRIT: 48.6 % (ref 38.5–50.0)
Hemoglobin: 16 g/dL (ref 13.2–17.1)
LYMPHS ABS: 2555 {cells}/uL (ref 850–3900)
MCH: 28.9 pg (ref 27.0–33.0)
MCHC: 32.9 g/dL (ref 32.0–36.0)
MCV: 87.7 fL (ref 80.0–100.0)
MPV: 9.8 fL (ref 7.5–12.5)
Monocytes Relative: 11.9 %
NEUTROS PCT: 46.9 %
Neutro Abs: 3283 cells/uL (ref 1500–7800)
Platelets: 425 10*3/uL — ABNORMAL HIGH (ref 140–400)
RBC: 5.54 10*6/uL (ref 4.20–5.80)
RDW: 12.3 % (ref 11.0–15.0)
TOTAL LYMPHOCYTE: 36.5 %
WBC: 7 10*3/uL (ref 3.8–10.8)

## 2018-07-16 LAB — LIPID PANEL
Cholesterol: 208 mg/dL — ABNORMAL HIGH (ref <200)
HDL: 44 mg/dL (ref >40)
LDL Cholesterol (Calc): 140 mg/dL (calc) — ABNORMAL HIGH
Non-HDL Cholesterol (Calc): 164 mg/dL (calc) — ABNORMAL HIGH (ref <130)
TRIGLYCERIDES: 119 mg/dL (ref <150)
Total CHOL/HDL Ratio: 4.7 (calc) (ref <5.0)

## 2018-07-16 LAB — THYROID PANEL WITH TSH
Free Thyroxine Index: 2.5 (ref 1.4–3.8)
T3 UPTAKE: 30 % (ref 22–35)
T4, Total: 8.4 ug/dL (ref 4.9–10.5)
TSH: 2.73 mIU/L (ref 0.40–4.50)

## 2018-07-16 LAB — TESTOSTERONE TOTAL,FREE,BIO, MALES
ALBUMIN MSPROF: 4.2 g/dL (ref 3.6–5.1)
SEX HORMONE BINDING: 38 nmol/L (ref 22–77)
Testosterone, Bioavailable: 62.9 ng/dL — ABNORMAL LOW (ref >=110.0)
Testosterone, Free: 32.7 pg/mL — ABNORMAL LOW (ref 46.0–224.0)
Testosterone: 286 ng/dL (ref 250–827)

## 2018-07-16 LAB — VITAMIN D 25 HYDROXY (VIT D DEFICIENCY, FRACTURES): VIT D 25 HYDROXY: 32 ng/mL (ref 30–100)

## 2018-07-16 LAB — PSA: PSA: 1.8 ng/mL (ref <=4.0)

## 2018-07-16 LAB — HIGH SENSITIVITY CRP: HS-CRP: 8.8 mg/L — AB

## 2018-07-16 NOTE — Addendum Note (Signed)
Addended by: Lillia Carmel on: 07/16/2018 12:07 PM   Modules accepted: Level of Service

## 2018-07-16 NOTE — Telephone Encounter (Signed)
I advised the patient.  He said he will await the mail and then let us know if he has any questions.  I advised him he could also access this through MyChart once he signs up for this.

## 2018-07-16 NOTE — Telephone Encounter (Signed)
Labs are notable for the following:  Thyroid function is in normal range.  Testosterone levels are slightly low, and this could affect memory.  I will include some over-the-counter suggestions for improvement of this.  Regular exercise remains helpful, as does dietary limitation of processed carbohydrates including breads, pastas, cereals, sugars and sweets.  Vitamin D level is borderline at 32.  We want this to be 50-80.  I recommend taking over-the-counter vitamin D3 at 2000 IU daily long-term.  C-reactive protein, inflammation marker, is elevated at 8.8.  This is a nonspecific test, but has been associated with elevated risk of cardiac disease if it remains high.  In general, exercise and dietary recommendations mentioned above will help improve this.  We should recheck in about 6 months.  Kidney function/creatinine is slightly elevated at 1.47.  This is actually better than it was 1 year ago.  No specific treatment needed for the moment, we will recheck this in 6 months as well.  Cholesterol numbers are borderline, but I would prefer to manage cardiac risk with lifestyle recommendations mentioned previously.  Prostate PSA looks good at 1.8.

## 2018-07-29 ENCOUNTER — Telehealth (INDEPENDENT_AMBULATORY_CARE_PROVIDER_SITE_OTHER): Payer: Self-pay

## 2018-07-29 NOTE — Telephone Encounter (Signed)
Submitted VOB for Monovisc, right knee. 

## 2018-08-10 ENCOUNTER — Telehealth (INDEPENDENT_AMBULATORY_CARE_PROVIDER_SITE_OTHER): Payer: Self-pay

## 2018-08-10 NOTE — Telephone Encounter (Signed)
Talked with patient and he has appointment scheduled for 08/16/2018 with Dr. Prince Rome for gel injection.

## 2018-08-10 NOTE — Telephone Encounter (Signed)
Talked with patient and advised him that he is approved for gel injection.  Approved for Monovisc, right knee. Buy & Bill Covered at 100% through insurance No Co-pay No PA required  Appt. 08/16/2018 with Dr. Prince Rome

## 2018-08-10 NOTE — Telephone Encounter (Signed)
-----   Message from Lavada Mesi, MD sent at 08/09/2018  4:13 PM EST ----- Regarding: Gel injection Phillip Jackson,  Would you please provide patient with a status update regarding approval for gel injections?  Thanks!

## 2018-08-16 ENCOUNTER — Ambulatory Visit (INDEPENDENT_AMBULATORY_CARE_PROVIDER_SITE_OTHER): Payer: Medicare Other | Admitting: Family Medicine

## 2018-08-16 ENCOUNTER — Encounter (INDEPENDENT_AMBULATORY_CARE_PROVIDER_SITE_OTHER): Payer: Self-pay | Admitting: Family Medicine

## 2018-08-16 VITALS — BP 139/84 | HR 74 | Ht 70.0 in | Wt 205.0 lb

## 2018-08-16 DIAGNOSIS — M1711 Unilateral primary osteoarthritis, right knee: Secondary | ICD-10-CM | POA: Diagnosis not present

## 2018-08-16 NOTE — Progress Notes (Signed)
   Office Visit Note   Patient: Phillip Jackson           Date of Birth: [birth year]           MRN: [id number] Visit Date: 08/16/2018 Requested by: No referring provider defined for this encounter. PCP: Patient, [name]  Subjective: Chief Complaint  Patient presents with  . Right Knee - Pain  . Injections    monovisc    HPI: He is here for Monovisc injection for right knee osteoarthritis.  Review his injections have helped significantly.              ROS: Noncontributory  Objective: Vital Signs: BP 139/84   Pulse 74   Ht 5\' 10"  (1.778 m)   Wt 205 lb (93 kg)   BMI 29.41 kg/m   Physical Exam:  Right knee: 1+ effusion, no warmth or erythema.  Good range of motion.  Imaging: None today.  Assessment & Plan: 1.  Osteoarthritis right knee -Monovisc given today.  Follow-up as needed.   Follow-Up Instructions: No follow-ups on file.      Procedures: Right knee Monovisc injection: After sterile prep with Betadine, injected 3 cc 1% lidocaine without epinephrine then Monovisc from superolateral approach, a flash of clear yellow synovial fluid was obtained prior to injection indicating intra-articular placement.    PMFS History: Patient Active Problem List   Diagnosis Date Noted  . History of nephrolithiasis 07/14/2018  . Unilateral primary osteoarthritis, right knee 07/14/2018  . Idiopathic chronic gout without tophus 07/14/2018  . Kidney stones 08/19/2013  . Hyperlipidemia 06/28/2013   Past Medical History:  Diagnosis Date  . History of kidney stones   . HLD (hyperlipidemia)   . 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 OA (osteoarthritis)     Family History  Problem Relation Age of Onset  . Heart attack Father 65  . Other Father        open heart surgery  . Diabetes Father   . Heart disease Other        uncles on both sides of famiily  . Hypertension Mother   . Diabetes Brother   . Cancer Neg Hx     Past  Surgical History:  Procedure Laterality Date  . KNEE SURGERY     multiple  . LEFT HEART CATH  2001  . ORTHOPEDIC SURGERY     several   Social History   Occupational History  . Not on file  Tobacco Use  . Smoking status: Never Smoker  . Smokeless tobacco: Never Used  Substance and Sexual Activity  . Alcohol use: No  . Drug use: No  . Sexual activity: Not on file

## 2018-09-07 ENCOUNTER — Ambulatory Visit (INDEPENDENT_AMBULATORY_CARE_PROVIDER_SITE_OTHER): Payer: Medicare Other | Admitting: Family Medicine

## 2018-09-07 ENCOUNTER — Encounter (INDEPENDENT_AMBULATORY_CARE_PROVIDER_SITE_OTHER): Payer: Self-pay | Admitting: Family Medicine

## 2018-09-07 VITALS — BP 132/77 | HR 68

## 2018-09-07 DIAGNOSIS — R519 Headache, unspecified: Secondary | ICD-10-CM

## 2018-09-07 DIAGNOSIS — R51 Headache: Secondary | ICD-10-CM

## 2018-09-07 NOTE — Progress Notes (Signed)
   Office Visit Note   Patient: Phillip Jackson           Date of Birth: 1951-01-21           MRN: [id number] Visit Date: 09/07/2018 Requested by: No referring provider defined for this encounter. PCP: Patient, [name]  Subjective: Chief Complaint  Patient presents with  . pain left side of head - started 3/01    HPI: Here with left-sided temporal area headache for 3 days.  Intermittent throbbing pain, no visual disturbance.  History of similar headache a couple years ago, he was referred to an ophthalmologist for possible biopsy but they decided it was not necessary.  He was not having exactly the same pain at that point.  Denies any head congestion, fevers or chills.              ROS: Otherwise noncontributory  Objective: Vital Signs: BP 132/77 (BP Location: Left Arm, Patient Position: Sitting, Cuff Size: Normal)   Pulse 68   Physical Exam:  HEENT:  [place]/AT, PERRLA, EOM Full, no nystagmus.  Funduscopic examination within normal limits.  No conjunctival erythema.  Tympanic membranes are pearly gray with normal landmarks.  External ear canals are normal.  Nasal passages are clear.  Oropharynx is clear.  No significant lymphadenopathy.  No thyromegaly or nodules.  2+ carotid pulses without bruits.  He is tender to palpation directly over the left temporal artery. CV: Regular rate and rhythm without murmurs, rubs, or gallops.  No peripheral edema.  2+ radial and posterior tibial pulses. Lungs: Clear to auscultation throughout with no wheezing or areas of consolidation.    Imaging: None today.  Assessment & Plan: 1.  Left-sided headache, suspect tension type headache but cannot rule out temporal arteritis -We will check C-reactive protein and sed rate today.  Consider ophthalmology referral if significantly elevated.  No medications yet.     Procedures: No procedures performed  No notes on file     PMFS History: Patient Active Problem List   Diagnosis Date Noted  .  History of nephrolithiasis 07/14/2018  . Unilateral primary osteoarthritis, right knee 07/14/2018  . Idiopathic chronic gout without tophus 07/14/2018  . Kidney stones 08/19/2013  . Hyperlipidemia 06/28/2013   Past Medical History:  Diagnosis Date  . History of kidney stones   . HLD (hyperlipidemia)   . 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 OA (osteoarthritis)     Family History  Problem Relation Age of Onset  . Heart attack Father 24  . Other Father        open heart surgery  . Diabetes Father   . Heart disease Other        uncles on both sides of famiily  . Hypertension Mother   . Diabetes Brother   . Cancer Neg Hx     Past Surgical History:  Procedure Laterality Date  . KNEE SURGERY     multiple  . LEFT HEART CATH  2001  . ORTHOPEDIC SURGERY     several   Social History   Occupational History  . Not on file  Tobacco Use  . Smoking status: Never Smoker  . Smokeless tobacco: Never Used  Substance and Sexual Activity  . Alcohol use: No  . Drug use: No  . Sexual activity: Not on file

## 2018-09-08 ENCOUNTER — Telehealth (INDEPENDENT_AMBULATORY_CARE_PROVIDER_SITE_OTHER): Payer: Self-pay | Admitting: Family Medicine

## 2018-09-08 ENCOUNTER — Encounter (INDEPENDENT_AMBULATORY_CARE_PROVIDER_SITE_OTHER): Payer: Self-pay | Admitting: Family Medicine

## 2018-09-08 LAB — SEDIMENTATION RATE: SED RATE: 6 mm/h (ref 0–20)

## 2018-09-08 LAB — C-REACTIVE PROTEIN: CRP: 3.1 mg/L

## 2018-09-08 MED ORDER — NABUMETONE 750 MG PO TABS: 750.0000 mg | ORAL_TABLET | Freq: Two times a day (BID) | ORAL | 6 refills | Status: DC | PRN

## 2018-09-08 NOTE — Telephone Encounter (Signed)
CRP and ESR are normal.  Temporal arteritis would be very unlikely with these results.  He'll notify me if symptoms worsen.

## 2018-10-23 DIAGNOSIS — Z23 Encounter for immunization: Secondary | ICD-10-CM | POA: Diagnosis not present

## 2018-10-23 DIAGNOSIS — S61412A Laceration without foreign body of left hand, initial encounter: Secondary | ICD-10-CM | POA: Diagnosis not present

## 2018-10-23 DIAGNOSIS — W268XXA Contact with other sharp object(s), not elsewhere classified, initial encounter: Secondary | ICD-10-CM | POA: Diagnosis not present

## 2018-10-23 DIAGNOSIS — Y998 Other external cause status: Secondary | ICD-10-CM | POA: Diagnosis not present

## 2018-10-25 ENCOUNTER — Other Ambulatory Visit: Payer: Self-pay

## 2018-10-25 ENCOUNTER — Encounter (INDEPENDENT_AMBULATORY_CARE_PROVIDER_SITE_OTHER): Payer: Self-pay | Admitting: Family Medicine

## 2018-10-25 ENCOUNTER — Ambulatory Visit (INDEPENDENT_AMBULATORY_CARE_PROVIDER_SITE_OTHER): Payer: Medicare Other | Admitting: Family Medicine

## 2018-10-25 DIAGNOSIS — S61012S Laceration without foreign body of left thumb without damage to nail, sequela: Secondary | ICD-10-CM

## 2018-10-25 NOTE — Progress Notes (Signed)
Office Visit Note   Patient: Phillip Jackson           Date of Birth: 1950-10-02           MRN: [id number] Visit Date: 10/25/2018 Requested by: No referring provider defined for this encounter. PCP: Patient, [name]  Subjective: Chief Complaint  Patient presents with  . Right Thumb - Injury    DOI 10/23/2018 laceration to thumb from a box cutter. Went to [facility] ED - sutures and was given a brace - the brace hurts so took it off. Thumb "quivers" at times, with pain up the forearm.    HPI: He is here with left thumb pain.  2 days ago he was cutting something with a box cutter and accidentally stabbed it into his left thumb.  He went to a local ER where he was given a tetanus booster and laceration repair.  He is doing well overall, but is noticing some quivering of his thumb at times and wanted to be sure everything was okay.  He is right-hand dominant.              ROS: No fevers or chills.  All other systems were reviewed and are negative.  Objective: Vital Signs: There were no vitals taken for this visit.  Physical Exam:  General:  Alert and oriented, in no acute distress. Pulm:  Breathing unlabored. Psy:  Normal mood, congruent affect. Skin/left thumb: Left thumb laceration on the radial side of his first metacarpal near the MCP joint looks clean and dry.  There are 5 interrupted sutures which are intact, the wound stays together nicely when he flexes and extends his MCP joint.  Light touch sensation is normal throughout his thumb.  Flexor and extensor tendon functions are intact.   Imaging: None today.  Assessment & Plan: 1.  2 days status post left thumb laceration -Reassurance, I suspect the quivering is due to muscle spasms.  Protect the wound, range of motion as tolerated.  Sutures out about 7 days post injury.  Follow-up as needed.     Procedures: No procedures performed  No notes on file     PMFS History: Patient Active Problem List   Diagnosis Date  Noted  . History of nephrolithiasis 07/14/2018  . Unilateral primary osteoarthritis, right knee 07/14/2018  . Idiopathic chronic gout without tophus 07/14/2018  . Kidney stones 08/19/2013  . Hyperlipidemia 06/28/2013   Past Medical History:  Diagnosis Date  . History of kidney stones   . HLD (hyperlipidemia)   . 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 OA (osteoarthritis)     Family History  Problem Relation Age of Onset  . Heart attack Father 15  . Other Father        open heart surgery  . Diabetes Father   . Heart disease Other        uncles on both sides of famiily  . Hypertension Mother   . Diabetes Brother   . Cancer Neg Hx     Past Surgical History:  Procedure Laterality Date  . KNEE SURGERY     multiple  . LEFT HEART CATH  2001  . ORTHOPEDIC SURGERY     several   Social History   Occupational History  . Not on file  Tobacco Use  . Smoking status: Never Smoker  . Smokeless tobacco: Never Used  Substance and Sexual Activity  . Alcohol use: No  . Drug  use: No  . Sexual activity: Not on file

## 2018-11-02 ENCOUNTER — Ambulatory Visit (INDEPENDENT_AMBULATORY_CARE_PROVIDER_SITE_OTHER): Payer: Medicare Other | Admitting: Family Medicine

## 2018-11-02 ENCOUNTER — Encounter (INDEPENDENT_AMBULATORY_CARE_PROVIDER_SITE_OTHER): Payer: Self-pay | Admitting: Family Medicine

## 2018-11-02 ENCOUNTER — Other Ambulatory Visit: Payer: Self-pay

## 2018-11-02 DIAGNOSIS — S61012S Laceration without foreign body of left thumb without damage to nail, sequela: Secondary | ICD-10-CM

## 2018-11-02 NOTE — Progress Notes (Signed)
Subjective: He is here for suture removal.  It has been about 10 days since his laceration repair for his left thumb.  Objective: Wound looks good, clean and dry.  Edges are well approximated.  No erythema.  Impression: Healing status post left thumb laceration repair.  Plan: Sutures removed, Steri-Strips applied.  Follow-up as needed.

## 2018-12-14 ENCOUNTER — Other Ambulatory Visit: Payer: Self-pay | Admitting: Family Medicine

## 2018-12-14 DIAGNOSIS — J069 Acute upper respiratory infection, unspecified: Secondary | ICD-10-CM

## 2019-02-14 ENCOUNTER — Ambulatory Visit: Payer: Self-pay | Admitting: Family Medicine

## 2019-05-19 ENCOUNTER — Telehealth: Payer: Self-pay

## 2019-05-19 NOTE — Telephone Encounter (Signed)
error 

## 2019-05-20 ENCOUNTER — Ambulatory Visit: Payer: Self-pay | Admitting: Family Medicine

## 2019-06-07 ENCOUNTER — Ambulatory Visit (INDEPENDENT_AMBULATORY_CARE_PROVIDER_SITE_OTHER): Payer: Medicare Other | Admitting: Family Medicine

## 2019-06-07 ENCOUNTER — Encounter: Payer: Self-pay | Admitting: Family Medicine

## 2019-06-07 ENCOUNTER — Other Ambulatory Visit: Payer: Self-pay

## 2019-06-07 VITALS — BP 141/88 | HR 64

## 2019-06-07 DIAGNOSIS — R030 Elevated blood-pressure reading, without diagnosis of hypertension: Secondary | ICD-10-CM

## 2019-06-07 DIAGNOSIS — R42 Dizziness and giddiness: Secondary | ICD-10-CM | POA: Diagnosis not present

## 2019-06-07 NOTE — Progress Notes (Signed)
Office Visit Note   Patient: Phillip Jackson           Date of Birth: August 01, 1950           MRN: [id number] Visit Date: 06/07/2019 Requested by: No referring provider defined for this encounter. PCP: Patient, [name]  Subjective: Chief Complaint  Patient presents with  . Dizziness  . right jaw pain x 2&1/2 months  . "artery issue"    HPI: He is here with a couple concerns.  He is having intermittent dizziness still.  We talked about this a year ago but it has gotten a little bit more frequent.  Sometimes it happens when his head is in a certain position but that is not always the case.  He feels lightheaded at times, and sometimes feels like a vertigo sensation.  He and his wife have noticed that his carotid arteries seem to be more visible.  He has been under a lot of stress lately and he has attributed it to that.  Denies any chest pain or headaches.  In the past he has had cardiac catheterization about 20 years ago showing clean arteries and roughly 5 years ago he had cardiac stress testing per Dr. Pernell Dupre which was normal.               ROS:   All other systems were reviewed and are negative.  Objective: Vital Signs: BP (!) 141/88   Pulse 64   Physical Exam:  General:  Alert and oriented, in no acute distress. Pulm:  Breathing unlabored. Psy:  Normal mood, congruent affect.  Neck: He has 2+ carotid pulses with no audible bruits. CV: Regular rate and rhythm without murmurs, rubs, or gallops.  No peripheral edema.  2+ radial and posterior tibial pulses. Lungs: Clear to auscultation throughout with no wheezing or areas of consolidation.    Imaging: None today.  Assessment & Plan: 1.  Dizziness, possibly positional vertigo -Physical therapy for vestibular rehab. -Carotid Dopplers to evaluate for stenosis. - Magnesium 400 mg daily for BP control.     Procedures: No procedures performed  No notes on file     PMFS History: Patient Active Problem List   Diagnosis Date Noted  . History of nephrolithiasis 07/14/2018  . Unilateral primary osteoarthritis, right knee 07/14/2018  . Idiopathic chronic gout without tophus 07/14/2018  . Kidney stones 08/19/2013  . Hyperlipidemia 06/28/2013   Past Medical History:  Diagnosis Date  . History of kidney stones   . HLD (hyperlipidemia)   . 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 OA (osteoarthritis)     Family History  Problem Relation Age of Onset  . Heart attack Father 36  . Other Father        open heart surgery  . Diabetes Father   . Heart disease Other        uncles on both sides of famiily  . Hypertension Mother   . Diabetes Brother   . Cancer Neg Hx     Past Surgical History:  Procedure Laterality Date  . KNEE SURGERY     multiple  . LEFT HEART CATH  2001  . ORTHOPEDIC SURGERY     several   Social History   Occupational History  . Not on file  Tobacco Use  . Smoking status: Never Smoker  . Smokeless tobacco: Never Used  Substance and Sexual Activity  . Alcohol use: No  . Drug use:  No  . Sexual activity: Not on file

## 2019-07-11 ENCOUNTER — Telehealth: Payer: Self-pay | Admitting: Family Medicine

## 2019-07-11 NOTE — Telephone Encounter (Signed)
Patient called. He would like to speak with someone about some test. His call back number is [phone]

## 2019-07-12 NOTE — Telephone Encounter (Signed)
He was calling checking on the status of the doppler to check for stenosis

## 2019-07-12 NOTE — Telephone Encounter (Signed)
Have you a moment to call him for me?

## 2019-07-12 NOTE — Telephone Encounter (Signed)
I called the patient - unavailable to talk at the moment. Will try him back later today.

## 2019-07-14 ENCOUNTER — Telehealth: Payer: Self-pay | Admitting: *Deleted

## 2019-07-14 NOTE — Telephone Encounter (Signed)
Pt is scheduled for Bilateral Carotid US on Friday Jan 8 at 3:45pm at [facility] imaging, I contacted pt and advised him of appt and pt stated due to the weather that is expected and having to take his wife to work he would rather wait and get it scheduled for next week. I gave pt the number to scheduling to call and schedule at his convenience. Pt stated he will call today to get rescheduled. I will forward this message as well to Dr. Prince Rome as Lorain Childes.

## 2019-07-14 NOTE — Telephone Encounter (Signed)
Thank you :)

## 2019-07-14 NOTE — Telephone Encounter (Signed)
Carotid doppler scheduled for 07/15/19 [facility] scheduled and notified the patient.

## 2019-07-15 ENCOUNTER — Encounter: Payer: Self-pay | Admitting: Family Medicine

## 2019-07-15 ENCOUNTER — Other Ambulatory Visit: Payer: Self-pay

## 2019-07-15 ENCOUNTER — Ambulatory Visit (INDEPENDENT_AMBULATORY_CARE_PROVIDER_SITE_OTHER): Payer: Medicare Other | Admitting: Family Medicine

## 2019-07-15 ENCOUNTER — Ambulatory Visit: Payer: Medicare Other

## 2019-07-15 VITALS — Ht 70.5 in | Wt 212.0 lb

## 2019-07-15 DIAGNOSIS — R109 Unspecified abdominal pain: Secondary | ICD-10-CM

## 2019-07-15 NOTE — Progress Notes (Signed)
   Office Visit Note   Patient: Phillip Jackson           Date of Birth: [birth year]           MRN: [id number] Visit Date: 07/15/2019 Requested by: No referring provider defined for this encounter. PCP: Patient, [name]  Subjective: Chief Complaint  Patient presents with  . Abdominal Pain  . swelling in lower abdomen    HPI: He is here with left lower abdominal pain.  Symptoms started about a week ago, no injury.  He has a history of a small hernia in the past that has not required surgery.  He does a lot of strenuous work, and it hurts to lift things.  No change in his bowel function.  He thinks it originally started after eating a large meal.              ROS: No fevers or chills.  All other systems were reviewed and are negative.  Objective: Vital Signs: Ht 5' 10.5" (1.791 m)   Wt 212 lb (96.2 kg)   BMI 29.99 kg/m   Physical Exam:  General:  Alert and oriented, in no acute distress. Pulm:  Breathing unlabored. Psy:  Normal mood, congruent affect. Skin: No rash. Abdomen: Bowel sounds are active.  He is tender in the left lower quadrant.  He has pain in the left inguinal canal but I do not feel a definite bulge.  Imaging: None today  Assessment & Plan: 1.  Left lower abdominal pain, question hernia -Referral to Dr. Dwain Sarna for further evaluation.     Procedures: No procedures performed  No notes on file     PMFS History: Patient Active Problem List   Diagnosis Date Noted  . History of nephrolithiasis 07/14/2018  . Unilateral primary osteoarthritis, right knee 07/14/2018  . Idiopathic chronic gout without tophus 07/14/2018  . Kidney stones 08/19/2013  . Hyperlipidemia 06/28/2013   Past Medical History:  Diagnosis Date  . History of kidney stones   . HLD (hyperlipidemia)   . 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 OA (osteoarthritis)     Family History  Problem Relation Age of Onset  . Heart attack Father  14  . Other Father        open heart surgery  . Diabetes Father   . Heart disease Other        uncles on both sides of famiily  . Hypertension Mother   . Diabetes Brother   . Cancer Neg Hx     Past Surgical History:  Procedure Laterality Date  . KNEE SURGERY     multiple  . LEFT HEART CATH  2001  . ORTHOPEDIC SURGERY     several   Social History   Occupational History  . Not on file  Tobacco Use  . Smoking status: Never Smoker  . Smokeless tobacco: Never Used  Substance and Sexual Activity  . Alcohol use: No  . Drug use: No  . Sexual activity: Not on file

## 2019-07-18 ENCOUNTER — Encounter: Payer: Self-pay | Admitting: Family Medicine

## 2019-07-18 ENCOUNTER — Ambulatory Visit (INDEPENDENT_AMBULATORY_CARE_PROVIDER_SITE_OTHER): Payer: Medicare Other | Admitting: Family Medicine

## 2019-07-18 ENCOUNTER — Other Ambulatory Visit: Payer: Self-pay

## 2019-07-18 ENCOUNTER — Encounter: Payer: Medicare Other | Admitting: Family Medicine

## 2019-07-18 VITALS — BP 132/85 | HR 72 | Ht 70.5 in | Wt 212.0 lb

## 2019-07-18 DIAGNOSIS — M545 Low back pain, unspecified: Secondary | ICD-10-CM

## 2019-07-18 DIAGNOSIS — Z0000 Encounter for general adult medical examination without abnormal findings: Secondary | ICD-10-CM | POA: Diagnosis not present

## 2019-07-18 DIAGNOSIS — R7989 Other specified abnormal findings of blood chemistry: Secondary | ICD-10-CM | POA: Diagnosis not present

## 2019-07-18 DIAGNOSIS — M1A00X0 Idiopathic chronic gout, unspecified site, without tophus (tophi): Secondary | ICD-10-CM

## 2019-07-18 DIAGNOSIS — Z125 Encounter for screening for malignant neoplasm of prostate: Secondary | ICD-10-CM | POA: Diagnosis not present

## 2019-07-18 DIAGNOSIS — R7982 Elevated C-reactive protein (CRP): Secondary | ICD-10-CM | POA: Diagnosis not present

## 2019-07-18 DIAGNOSIS — E785 Hyperlipidemia, unspecified: Secondary | ICD-10-CM

## 2019-07-18 DIAGNOSIS — E349 Endocrine disorder, unspecified: Secondary | ICD-10-CM

## 2019-07-18 DIAGNOSIS — G8929 Other chronic pain: Secondary | ICD-10-CM

## 2019-07-18 DIAGNOSIS — N200 Calculus of kidney: Secondary | ICD-10-CM | POA: Diagnosis not present

## 2019-07-18 DIAGNOSIS — R030 Elevated blood-pressure reading, without diagnosis of hypertension: Secondary | ICD-10-CM | POA: Diagnosis not present

## 2019-07-18 NOTE — Progress Notes (Signed)
Office Visit Note   Patient: Phillip Jackson           Date of Birth: February 05, 1951           MRN: [id number] Visit Date: 07/18/2019 Requested by: No referring provider defined for this encounter. PCP: Patient, [name]  Subjective: Chief Complaint  Patient presents with  . Annual Exam    HPI: He is here for a wellness examination.  Overall doing well.  His blood pressure has been borderline elevated at home.  He is asymptomatic from that standpoint.  He still has intermittent episodes of dizziness for which he is getting ready to start physical therapy.  He reports chronic intermittent low back pain, midline without radiation, since falling off a vehicle while while in the [employer].  It bothers him mainly when doing strenuous work.  Gout is asymptomatic.  He is not having any issues with kidney stones.  Hyperlipidemia is being managed with lifestyle.  Last year labs are notable for borderline low testosterone level.  He is minimally symptomatic from this.  He also had an elevated C-reactive protein but subsequent testing a few months later showed normal results.  No changes in the family history.               ROS: No fevers or chills, no cough or congestion, no abdominal symptoms.  All other systems were reviewed and are negative.  Objective: Vital Signs: BP 132/85   Pulse 72   Ht 5' 10.5" (1.791 m)   Wt 212 lb (96.2 kg)   BMI 29.99 kg/m   Physical Exam:  General:  Alert and oriented, in no acute distress. Pulm:  Breathing unlabored. Psy:  Normal mood, congruent affect. Skin: No suspicious lesions. HEENT:  [place]/AT, PERRLA, EOM Full, no nystagmus.  Funduscopic examination within normal limits.  No conjunctival erythema.  Tympanic membranes are pearly gray with normal landmarks.  External ear canals are normal.  Nasal passages are clear.  Oropharynx is clear.  No significant lymphadenopathy.  No thyromegaly or nodules.  2+ carotid pulses without bruits. CV: Regular  rate and rhythm without murmurs, rubs, or gallops.  No peripheral edema.  2+ radial and posterior tibial pulses. Lungs: Clear to auscultation throughout with no wheezing or areas of consolidation. Abdomen: No hepatosplenomegaly. Low back: Slightly tender near the midline of the L5-S1 level.    Imaging: None today  Assessment & Plan: 1.  Wellness examination -Labs today, follow-up in about 6 months for recheck.  2.  Borderline blood pressure elevation -Currently on magnesium.  He will continue to monitor at home.  He does have a family history of this at some point we might need to start medication if it continues to rise.  3.  Chronic intermittent low back pain -He plans to go to the [facility] for additional evaluation.  4.  Hyperlipidemia  5.  History of kidney stones, history of gout.  Both asymptomatic now.     Procedures: No procedures performed  No notes on file     PMFS History: Patient Active Problem List   Diagnosis Date Noted  . History of nephrolithiasis 07/14/2018  . Unilateral primary osteoarthritis, right knee 07/14/2018  . Idiopathic chronic gout without tophus 07/14/2018  . Kidney stones 08/19/2013  . Hyperlipidemia 06/28/2013   Past Medical History:  Diagnosis Date  . History of kidney stones   . HLD (hyperlipidemia)   . 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  OA (osteoarthritis)     Family History  Problem Relation Age of Onset  . Heart attack Father 41  . Other Father        open heart surgery  . Diabetes Father   . Heart disease Other        uncles on both sides of famiily  . Hypertension Mother   . Diabetes Brother   . Hypertension Maternal Aunt   . Cancer Neg Hx     Past Surgical History:  Procedure Laterality Date  . KNEE SURGERY     multiple  . LEFT HEART CATH  2001  . ORTHOPEDIC SURGERY     several   Social History   Occupational History  . Not on file  Tobacco Use  . Smoking  status: Never Smoker  . Smokeless tobacco: Never Used  Substance and Sexual Activity  . Alcohol use: No  . Drug use: No  . Sexual activity: Not on file

## 2019-07-20 ENCOUNTER — Encounter: Payer: Self-pay | Admitting: Family Medicine

## 2019-07-20 ENCOUNTER — Telehealth: Payer: Self-pay | Admitting: Family Medicine

## 2019-07-20 ENCOUNTER — Ambulatory Visit
Admission: RE | Admit: 2019-07-20 | Discharge: 2019-07-20 | Disposition: A | Discharge status: Home / Self Care | Payer: Medicare Other | Source: Ambulatory Visit | Attending: Family Medicine | Admitting: Family Medicine

## 2019-07-20 DIAGNOSIS — R7989 Other specified abnormal findings of blood chemistry: Secondary | ICD-10-CM

## 2019-07-20 DIAGNOSIS — R42 Dizziness and giddiness: Secondary | ICD-10-CM

## 2019-07-20 LAB — CBC WITH DIFFERENTIAL/PLATELET
Absolute Monocytes: 681 cells/uL (ref 200–950)
Basophils Absolute: 111 cells/uL (ref 0–200)
Basophils Relative: 1.5 %
Eosinophils Absolute: 67 cells/uL (ref 15–500)
Eosinophils Relative: 0.9 %
HCT: 50.1 % — ABNORMAL HIGH (ref 38.5–50.0)
Hemoglobin: 16.4 g/dL (ref 13.2–17.1)
Lymphs Abs: 2486 cells/uL (ref 850–3900)
MCH: 29.9 pg (ref 27.0–33.0)
MCHC: 32.7 g/dL (ref 32.0–36.0)
MCV: 91.4 fL (ref 80.0–100.0)
MPV: 9.6 fL (ref 7.5–12.5)
Monocytes Relative: 9.2 %
Neutro Abs: 4055 cells/uL (ref 1500–7800)
Neutrophils Relative %: 54.8 %
Platelets: 366 10*3/uL (ref 140–400)
RBC: 5.48 10*6/uL (ref 4.20–5.80)
RDW: 13.3 % (ref 11.0–15.0)
Total Lymphocyte: 33.6 %
WBC: 7.4 10*3/uL (ref 3.8–10.8)

## 2019-07-20 LAB — COMPREHENSIVE METABOLIC PANEL
AG Ratio: 1.6 (calc) (ref 1.0–2.5)
ALT: 24 U/L (ref 9–46)
AST: 24 U/L (ref 10–35)
Albumin: 4.6 g/dL (ref 3.6–5.1)
Alkaline phosphatase (APISO): 55 U/L (ref 35–144)
BUN/Creatinine Ratio: 22 (calc) (ref 6–22)
BUN: 33 mg/dL — ABNORMAL HIGH (ref 7–25)
CO2: 25 mmol/L (ref 20–32)
Calcium: 9.9 mg/dL (ref 8.6–10.3)
Chloride: 105 mmol/L (ref 98–110)
Creat: 1.48 mg/dL — ABNORMAL HIGH (ref 0.70–1.25)
Globulin: 2.9 g/dL (calc) (ref 1.9–3.7)
Glucose, Bld: 75 mg/dL (ref 65–99)
Potassium: 5.2 mmol/L (ref 3.5–5.3)
Sodium: 140 mmol/L (ref 135–146)
Total Bilirubin: 0.5 mg/dL (ref 0.2–1.2)
Total Protein: 7.5 g/dL (ref 6.1–8.1)

## 2019-07-20 LAB — PSA: PSA: 1.9 ng/mL (ref <=4.0)

## 2019-07-20 LAB — LIPID PANEL
Cholesterol: 205 mg/dL — ABNORMAL HIGH (ref <200)
HDL: 54 mg/dL (ref >=40)
LDL Cholesterol (Calc): 137 mg/dL (calc) — ABNORMAL HIGH
Non-HDL Cholesterol (Calc): 151 mg/dL (calc) — ABNORMAL HIGH (ref <130)
Total CHOL/HDL Ratio: 3.8 (calc) (ref <5.0)
Triglycerides: 50 mg/dL (ref <150)

## 2019-07-20 NOTE — Telephone Encounter (Signed)
Tried calling patient with results. No answer. Will try again tomorrow. Already placed the patient's results in the mail to his home address.

## 2019-07-20 NOTE — Telephone Encounter (Signed)
Labs show that the kidney function/creatinine level remains slightly abnormal at 1.48.  It is about the same as it was last year, which is better than it was before that.  Since I am not completely sure what is causing this, I would like to get a consultation with a nephrologist/kidney specialist.  I will place orders for referral.  Lipid numbers are slightly abnormal.  This generally improves with regular exercise and dietary limitation of processed carbohydrates including breads, pastas, cereals, sugars and sweets.  Prostate PSA looks good.  CBC looks good.

## 2019-07-20 NOTE — Telephone Encounter (Signed)
Carotid dopplers show no signs of blockage in the arteries.

## 2019-07-21 NOTE — Telephone Encounter (Signed)
Advised the patient

## 2019-07-21 NOTE — Telephone Encounter (Signed)
I called and advised the patient of his results and plan. He voiced understanding and is ok with proceeding with the nephrology referral.

## 2019-08-15 ENCOUNTER — Ambulatory Visit (INDEPENDENT_AMBULATORY_CARE_PROVIDER_SITE_OTHER): Payer: Medicare Other | Admitting: Family Medicine

## 2019-08-15 ENCOUNTER — Other Ambulatory Visit: Payer: Self-pay

## 2019-08-15 ENCOUNTER — Encounter: Payer: Self-pay | Admitting: Family Medicine

## 2019-08-15 ENCOUNTER — Other Ambulatory Visit: Payer: Self-pay | Admitting: Family Medicine

## 2019-08-15 VITALS — BP 138/89 | HR 73

## 2019-08-15 DIAGNOSIS — R109 Unspecified abdominal pain: Secondary | ICD-10-CM | POA: Diagnosis not present

## 2019-08-15 DIAGNOSIS — N200 Calculus of kidney: Secondary | ICD-10-CM

## 2019-08-15 DIAGNOSIS — E559 Vitamin D deficiency, unspecified: Secondary | ICD-10-CM | POA: Diagnosis not present

## 2019-08-15 DIAGNOSIS — R002 Palpitations: Secondary | ICD-10-CM | POA: Diagnosis not present

## 2019-08-15 LAB — VITAMIN D 25 HYDROXY (VIT D DEFICIENCY, FRACTURES): Vit D, 25-Hydroxy: 27 ng/mL — ABNORMAL LOW (ref 30–100)

## 2019-08-15 NOTE — Addendum Note (Signed)
Addended by: Lillia Carmel on: 08/15/2019 03:14 PM   Modules accepted: Orders

## 2019-08-15 NOTE — Progress Notes (Signed)
Office Visit Note   Patient: Phillip Jackson           Date of Birth: Apr 24, 1951           MRN: [id number] Visit Date: 08/15/2019 Requested by: No referring provider defined for this encounter. PCP: Patient, [name]  Subjective: Chief Complaint  Patient presents with  . discuss kidney issues    HPI: He is here with a few concerns.  For the past couple months he has had intermittent palpitations.  They occur about every other day, and last for a few minutes.  It feels like his heart is racing and pounding in his chest.  No chest pain, no shortness of breath, no diaphoresis.  He is asymptomatic right now.  He is scheduled later this month to meet with Phillip Jackson for his abdominal hernia symptoms.  Recently he attended his uncle's funeral and after sitting for a few hours, he developed severe pain, worse than his kidney stone pain in the left lower abdomen.  It is still sore, but not nearly as sore as it was that day.  No change with having a bowel movement.  He is also not yet scheduled with nephrology to discuss his creatinine elevation.  He is concerned about the delay in getting an appointment.  I was able to access his records from our old system, and dating back to 2011, his creatinine has been fairly consistently at the level that it is right now.               ROS: No fever or chills.  All other systems were reviewed and are negative.  Objective: Vital Signs: BP 138/89   Pulse 73   Physical Exam:  General:  Alert and oriented, in no acute distress. Pulm:  Breathing unlabored. Psy:  Normal mood, congruent affect.  CV: Regular rate and rhythm without murmurs, rubs, or gallops.  No peripheral edema.  2+ radial and posterior tibial pulses. Lungs: Clear to auscultation throughout with no wheezing or areas of consolidation. Abdomen: He is tender near the left inguinal canal and just above it in the lower abdomen, no rebound or guarding.  No palpable  mass.   Imaging: None today  Assessment & Plan: 1.  Worsening left lower abdomen pain, possible hernia -I will check to see if he can get in sooner with Phillip Jackson.  2.  Palpitations -We will order cardiac monitoring.  3.  History of kidney stones -He would like referral to a new urologist so we will arrange that for him.  4.  History of vitamin D deficiency, currently taking supplement. -We will check to see if his levels are in therapeutic range.     Procedures: No procedures performed  No notes on file     PMFS History: Patient Active Problem List   Diagnosis Date Noted  . History of nephrolithiasis 07/14/2018  . Unilateral primary osteoarthritis, right knee 07/14/2018  . Idiopathic chronic gout without tophus 07/14/2018  . Kidney stones 08/19/2013  . Hyperlipidemia 06/28/2013   Past Medical History:  Diagnosis Date  . History of kidney stones   . HLD (hyperlipidemia)   . 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 OA (osteoarthritis)     Family History  Problem Relation Age of Onset  . Heart attack Father 13  . Other Father        open heart surgery  . Diabetes Father   . Heart  disease Other        uncles on both sides of famiily  . Hypertension Mother   . Diabetes Brother   . Hypertension Maternal Aunt   . Cancer Neg Hx     Past Surgical History:  Procedure Laterality Date  . KNEE SURGERY     multiple  . LEFT HEART CATH  2001  . ORTHOPEDIC SURGERY     several   Social History   Occupational History  . Not on file  Tobacco Use  . Smoking status: Never Smoker  . Smokeless tobacco: Never Used  Substance and Sexual Activity  . Alcohol use: No  . Drug use: No  . Sexual activity: Not on file

## 2019-08-16 ENCOUNTER — Telehealth: Payer: Self-pay | Admitting: *Deleted

## 2019-08-16 ENCOUNTER — Telehealth: Payer: Self-pay | Admitting: Family Medicine

## 2019-08-16 NOTE — Telephone Encounter (Signed)
Patient enrolled for [employer] to mail a 10 day ZIO XT long term holter monitor to his home.  Instructions will be included in the monitor kit.

## 2019-08-16 NOTE — Telephone Encounter (Signed)
Vitamin D is low at 27.  Will increase dosage to 5,000 IU daily and recheck in 4-6 months.

## 2019-08-22 DIAGNOSIS — H8113 Benign paroxysmal vertigo, bilateral: Secondary | ICD-10-CM | POA: Diagnosis not present

## 2019-08-23 ENCOUNTER — Telehealth: Payer: Self-pay | Admitting: Family Medicine

## 2019-08-23 NOTE — Telephone Encounter (Signed)
Phillip Jackson @ [name] called. Patient arrived at appt without insur cards ,requesting demographics to be faxed. I faxed to her [phone]

## 2019-08-26 ENCOUNTER — Ambulatory Visit (INDEPENDENT_AMBULATORY_CARE_PROVIDER_SITE_OTHER): Payer: Medicare Other

## 2019-08-26 DIAGNOSIS — R002 Palpitations: Secondary | ICD-10-CM | POA: Diagnosis not present

## 2019-08-27 ENCOUNTER — Other Ambulatory Visit: Payer: Self-pay | Admitting: Family Medicine

## 2019-08-27 DIAGNOSIS — R7989 Other specified abnormal findings of blood chemistry: Secondary | ICD-10-CM

## 2019-08-27 DIAGNOSIS — R42 Dizziness and giddiness: Secondary | ICD-10-CM

## 2019-08-29 NOTE — Progress Notes (Signed)
Ok, I called over to [facility], there is a possiblity pt can be seen on Feb 24th to see Dr. Janit Pagan. He is located in [place].I faxed over the referral as Urgent so they can review it and give pt a call.

## 2019-08-29 NOTE — Progress Notes (Signed)
Are you still wanting pt to see Dr. Chandra Batch? Or do you want to send to another specialist?

## 2019-08-29 NOTE — Progress Notes (Signed)
Ok, thanks.

## 2019-08-29 NOTE — Progress Notes (Signed)
First available would be great.  This week if they have something.

## 2019-08-31 DIAGNOSIS — N1831 Chronic kidney disease, stage 3a: Secondary | ICD-10-CM | POA: Insufficient documentation

## 2019-08-31 DIAGNOSIS — N200 Calculus of kidney: Secondary | ICD-10-CM | POA: Diagnosis not present

## 2019-09-01 ENCOUNTER — Other Ambulatory Visit: Payer: Self-pay | Admitting: General Surgery

## 2019-09-01 DIAGNOSIS — R1032 Left lower quadrant pain: Secondary | ICD-10-CM | POA: Diagnosis not present

## 2019-09-05 ENCOUNTER — Other Ambulatory Visit: Payer: Self-pay | Admitting: General Surgery

## 2019-09-05 DIAGNOSIS — R1032 Left lower quadrant pain: Secondary | ICD-10-CM

## 2019-09-06 ENCOUNTER — Ambulatory Visit: Payer: Self-pay

## 2019-09-06 ENCOUNTER — Encounter: Payer: Self-pay | Admitting: Family Medicine

## 2019-09-06 ENCOUNTER — Ambulatory Visit (INDEPENDENT_AMBULATORY_CARE_PROVIDER_SITE_OTHER): Payer: Medicare Other | Admitting: Family Medicine

## 2019-09-06 ENCOUNTER — Other Ambulatory Visit: Payer: Self-pay

## 2019-09-06 DIAGNOSIS — M545 Low back pain: Secondary | ICD-10-CM

## 2019-09-06 DIAGNOSIS — G8929 Other chronic pain: Secondary | ICD-10-CM | POA: Diagnosis not present
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
## 2019-09-08 ENCOUNTER — Ambulatory Visit
Admission: RE | Admit: 2019-09-08 | Discharge: 2019-09-08 | Disposition: A | Discharge status: Home / Self Care | Payer: Medicare Other | Source: Ambulatory Visit | Attending: General Surgery | Admitting: General Surgery

## 2019-09-08 DIAGNOSIS — N200 Calculus of kidney: Secondary | ICD-10-CM | POA: Diagnosis not present

## 2019-09-08 DIAGNOSIS — R1032 Left lower quadrant pain: Secondary | ICD-10-CM

## 2019-09-09 ENCOUNTER — Other Ambulatory Visit: Payer: Self-pay | Admitting: Family Medicine

## 2019-09-09 DIAGNOSIS — R109 Unspecified abdominal pain: Secondary | ICD-10-CM

## 2019-09-12 DIAGNOSIS — R002 Palpitations: Secondary | ICD-10-CM | POA: Diagnosis not present

## 2019-09-19 ENCOUNTER — Telehealth: Payer: Self-pay | Admitting: Family Medicine

## 2019-09-19 NOTE — Telephone Encounter (Signed)
Preliminary Zio results given to patient:  Patient had a min HR of 45 bpm, max HR of 190 bpm, and avg HR of 74 bpm. Predominant underlying rhythm was Sinus Rhythm. 6 Supraventricular Tachycardia runs occurred, the run with the fastest interval lasting 20 beats with a max rate of 190 bpm (avg 176 bpm); the run with the fastest interval was also the longest. Isolated SVEs were rare (<1.0%), SVE Couplets were rare (<1.0%), and SVE Triplets were rare (<1.0%). Isolated VEs were rare (<1.0%), VE Couplets were rare (<1.0%), and no VE Triplets were present. Ventricular Bigeminy and Trigeminy were present.

## 2019-10-21 DIAGNOSIS — N200 Calculus of kidney: Secondary | ICD-10-CM | POA: Diagnosis not present

## 2019-10-24 ENCOUNTER — Encounter: Payer: Self-pay | Admitting: Family Medicine

## 2019-10-24 ENCOUNTER — Ambulatory Visit (INDEPENDENT_AMBULATORY_CARE_PROVIDER_SITE_OTHER): Payer: Medicare Other | Admitting: Family Medicine

## 2019-10-24 ENCOUNTER — Other Ambulatory Visit: Payer: Self-pay

## 2019-10-24 ENCOUNTER — Ambulatory Visit: Payer: Self-pay

## 2019-10-24 DIAGNOSIS — M5441 Lumbago with sciatica, right side: Secondary | ICD-10-CM

## 2019-10-24 DIAGNOSIS — M5442 Lumbago with sciatica, left side: Secondary | ICD-10-CM | POA: Diagnosis not present

## 2019-10-24 MED ORDER — BACLOFEN 10 MG PO TABS: 5.0000 mg | ORAL_TABLET | Freq: Three times a day (TID) | ORAL | 3 refills | Status: DC | PRN

## 2019-10-24 MED ORDER — OXYCODONE-ACETAMINOPHEN 5-325 MG PO TABS: 1.0000 | ORAL_TABLET | Freq: Four times a day (QID) | ORAL | 0 refills | Status: DC | PRN
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
## 2019-10-26 ENCOUNTER — Other Ambulatory Visit: Payer: Self-pay | Admitting: Family Medicine

## 2019-10-26 DIAGNOSIS — M5442 Lumbago with sciatica, left side: Secondary | ICD-10-CM

## 2019-10-26 DIAGNOSIS — M5441 Lumbago with sciatica, right side: Secondary | ICD-10-CM

## 2019-10-26 MED ORDER — METHOCARBAMOL 500 MG PO TABS: 500.0000 mg | ORAL_TABLET | Freq: Four times a day (QID) | ORAL | 3 refills | Status: DC | PRN

## 2019-10-26 MED ORDER — METHYLPREDNISOLONE 4 MG PO TBPK: ORAL_TABLET | ORAL | 0 refills | Status: DC

## 2019-10-26 NOTE — Progress Notes (Signed)
Patient still having severe back pain despite meds and PT.  Will proceed with MRI.

## 2019-10-28 DIAGNOSIS — M542 Cervicalgia: Secondary | ICD-10-CM | POA: Diagnosis not present

## 2019-10-28 DIAGNOSIS — R262 Difficulty in walking, not elsewhere classified: Secondary | ICD-10-CM | POA: Diagnosis not present

## 2019-10-28 DIAGNOSIS — M2560 Stiffness of unspecified joint, not elsewhere classified: Secondary | ICD-10-CM | POA: Diagnosis not present

## 2019-10-28 DIAGNOSIS — M545 Low back pain: Secondary | ICD-10-CM | POA: Diagnosis not present

## 2019-10-31 DIAGNOSIS — M545 Low back pain: Secondary | ICD-10-CM | POA: Diagnosis not present

## 2019-10-31 DIAGNOSIS — M2560 Stiffness of unspecified joint, not elsewhere classified: Secondary | ICD-10-CM | POA: Diagnosis not present

## 2019-10-31 DIAGNOSIS — R262 Difficulty in walking, not elsewhere classified: Secondary | ICD-10-CM | POA: Diagnosis not present

## 2019-10-31 DIAGNOSIS — M542 Cervicalgia: Secondary | ICD-10-CM | POA: Diagnosis not present

## 2019-11-01 DIAGNOSIS — M2560 Stiffness of unspecified joint, not elsewhere classified: Secondary | ICD-10-CM | POA: Diagnosis not present

## 2019-11-01 DIAGNOSIS — R262 Difficulty in walking, not elsewhere classified: Secondary | ICD-10-CM | POA: Diagnosis not present

## 2019-11-01 DIAGNOSIS — M542 Cervicalgia: Secondary | ICD-10-CM | POA: Diagnosis not present

## 2019-11-01 DIAGNOSIS — M545 Low back pain: Secondary | ICD-10-CM | POA: Diagnosis not present

## 2019-11-03 DIAGNOSIS — M542 Cervicalgia: Secondary | ICD-10-CM | POA: Diagnosis not present

## 2019-11-03 DIAGNOSIS — M545 Low back pain: Secondary | ICD-10-CM | POA: Diagnosis not present

## 2019-11-03 DIAGNOSIS — M2560 Stiffness of unspecified joint, not elsewhere classified: Secondary | ICD-10-CM | POA: Diagnosis not present

## 2019-11-03 DIAGNOSIS — R262 Difficulty in walking, not elsewhere classified: Secondary | ICD-10-CM | POA: Diagnosis not present

## 2019-11-06 ENCOUNTER — Telehealth: Payer: Self-pay | Admitting: Family Medicine

## 2019-11-06 DIAGNOSIS — M5442 Lumbago with sciatica, left side: Secondary | ICD-10-CM

## 2019-11-06 DIAGNOSIS — M5441 Lumbago with sciatica, right side: Secondary | ICD-10-CM

## 2019-11-06 NOTE — Telephone Encounter (Signed)
Patient is still in severe pain despite PT, unable to stand upright or drive longer than 15 minutes.  He now has extensive bruising across his lower back.  Pain mostly midline with occasional right-sided sciatica, no incontinence.  Will order MRI to rule out fracture.

## 2019-11-08 ENCOUNTER — Encounter: Payer: Self-pay | Admitting: Family Medicine

## 2019-11-18 DIAGNOSIS — M542 Cervicalgia: Secondary | ICD-10-CM | POA: Diagnosis not present

## 2019-11-18 DIAGNOSIS — R262 Difficulty in walking, not elsewhere classified: Secondary | ICD-10-CM | POA: Diagnosis not present

## 2019-11-18 DIAGNOSIS — M2560 Stiffness of unspecified joint, not elsewhere classified: Secondary | ICD-10-CM | POA: Diagnosis not present

## 2019-11-18 DIAGNOSIS — M545 Low back pain: Secondary | ICD-10-CM | POA: Diagnosis not present

## 2019-12-03 ENCOUNTER — Other Ambulatory Visit: Payer: Self-pay

## 2019-12-03 ENCOUNTER — Ambulatory Visit
Admission: RE | Admit: 2019-12-03 | Discharge: 2019-12-03 | Disposition: A | Discharge status: Home / Self Care | Payer: Medicare Other | Source: Ambulatory Visit | Attending: Family Medicine | Admitting: Family Medicine

## 2019-12-03 DIAGNOSIS — M5441 Lumbago with sciatica, right side: Secondary | ICD-10-CM

## 2019-12-06 ENCOUNTER — Other Ambulatory Visit: Payer: Self-pay | Admitting: Family Medicine

## 2019-12-06 DIAGNOSIS — M5441 Lumbago with sciatica, right side: Secondary | ICD-10-CM

## 2019-12-06 DIAGNOSIS — M5442 Lumbago with sciatica, left side: Secondary | ICD-10-CM

## 2019-12-12 ENCOUNTER — Ambulatory Visit (INDEPENDENT_AMBULATORY_CARE_PROVIDER_SITE_OTHER): Payer: Medicare Other

## 2019-12-12 ENCOUNTER — Encounter: Payer: Self-pay | Admitting: Family Medicine

## 2019-12-12 ENCOUNTER — Other Ambulatory Visit: Payer: Self-pay

## 2019-12-12 ENCOUNTER — Ambulatory Visit (INDEPENDENT_AMBULATORY_CARE_PROVIDER_SITE_OTHER): Payer: Medicare Other | Admitting: Family Medicine

## 2019-12-12 DIAGNOSIS — M5441 Lumbago with sciatica, right side: Secondary | ICD-10-CM

## 2019-12-12 DIAGNOSIS — M1711 Unilateral primary osteoarthritis, right knee: Secondary | ICD-10-CM | POA: Diagnosis not present

## 2019-12-12 DIAGNOSIS — H43811 Vitreous degeneration, right eye: Secondary | ICD-10-CM | POA: Diagnosis not present

## 2019-12-12 DIAGNOSIS — M20019 Mallet finger of unspecified finger(s): Secondary | ICD-10-CM

## 2019-12-12 DIAGNOSIS — H18413 Arcus senilis, bilateral: Secondary | ICD-10-CM | POA: Diagnosis not present

## 2019-12-12 DIAGNOSIS — H40002 Preglaucoma, unspecified, left eye: Secondary | ICD-10-CM | POA: Diagnosis not present

## 2019-12-12 DIAGNOSIS — M79645 Pain in left finger(s): Secondary | ICD-10-CM

## 2019-12-12 DIAGNOSIS — M5442 Lumbago with sciatica, left side: Secondary | ICD-10-CM

## 2019-12-12 DIAGNOSIS — S62639A Displaced fracture of distal phalanx of unspecified finger, initial encounter for closed fracture: Secondary | ICD-10-CM | POA: Diagnosis not present

## 2019-12-12 DIAGNOSIS — H2513 Age-related nuclear cataract, bilateral: Secondary | ICD-10-CM | POA: Diagnosis not present

## 2019-12-12 NOTE — Progress Notes (Signed)
Office Visit Note   Patient: Phillip Jackson           Date of Birth: [birth year]           MRN: [id number] Visit Date: 12/12/2019 Requested by: Eunice Blase, MD [address] [place],  [place] [address] PCP: Eunice Blase, MD  Subjective: Chief Complaint  Patient presents with  . Lower Back - Pain    Continues to have a lot of pain in the lower back.  . Right Knee - Pain  . Left Ring Finger - Pain    Mash injury to finger 2 weeks ago. Finger initially swelled and was red. Now distal portion of finger is not straight.    HPI: He is here in a few concerns.  2 weeks ago he had a crush injury to his left fourth finger.  Since then he has been unable to actively extend the DIP joint.  His low back continues to hurt fairly severely.  He was unable to tolerate a standard MRI scan.  His right knee is bothering him again.  He has osteoarthritis.  He has done very well in the past with Synvisc injections and would like to try that again.                ROS:   All other systems were reviewed and are negative.  Objective: Vital Signs: There were no vitals taken for this visit.  Physical Exam:  General:  Alert and oriented, in no acute distress. Pulm:  Breathing unlabored. Psy:  Normal mood, congruent affect. Skin: There is bruising on the dorsum of his left fourth finger DIP joint. Left hand: His fourth finger has a mallet deformity, unable to actively extend.  Passive range of motion is normal.  Very tender to palpation on the dorsum of the DIP joint. Low back: He still very tender in the lumbar paraspinous muscles and in the midline. Right knee: Trace effusion with no warmth.  Tender on the medial joint line.  Pain but no palpable click with McMurray's.    Imaging: XR Finger Ring Left  Result Date: 12/12/2019 X-rays left fourth finger reveal a bony mallet fracture involving about 50% of the joint surface.   Assessment & Plan: 1.  2-week status post left fourth finger  contusion with bony mallet fracture, acceptably aligned. -We will splint in hyperextension for the next 3 to 4 weeks and then he will return for two-view x-rays. - PT for mallet protocol.  2.  Persistent low back pain -Open MRI scan.  3.  Right knee osteoarthritis -We will request approval for gel injections.     Procedures: No procedures performed  No notes on file     PMFS History: Patient Active Problem List   Diagnosis Date Noted  . Mallet fracture, closed, initial encounter 12/12/2019  . History of nephrolithiasis 07/14/2018  . Unilateral primary osteoarthritis, right knee 07/14/2018  . Idiopathic chronic gout without tophus 07/14/2018  . Kidney stones 08/19/2013  . Hyperlipidemia 06/28/2013   Past Medical History:  Diagnosis Date  . History of kidney stones   . HLD (hyperlipidemia)   . 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 OA (osteoarthritis)     Family History  Problem Relation Age of Onset  . Heart attack Father 63  . Other Father        open heart surgery  . Diabetes Father   . Heart disease Other  uncles on both sides of famiily  . Hypertension Mother   . Diabetes Brother   . Hypertension Maternal Aunt   . Cancer Neg Hx     Past Surgical History:  Procedure Laterality Date  . KNEE SURGERY     multiple  . LEFT HEART CATH  2001  . ORTHOPEDIC SURGERY     several   Social History   Occupational History  . Not on file  Tobacco Use  . Smoking status: Never Smoker  . Smokeless tobacco: Never Used  Substance and Sexual Activity  . Alcohol use: No  . Drug use: No  . Sexual activity: Not on file

## 2019-12-12 NOTE — Progress Notes (Signed)
Noted  

## 2019-12-13 ENCOUNTER — Telehealth: Payer: Self-pay

## 2019-12-13 NOTE — Telephone Encounter (Signed)
Submitted VOB for Monovisc, right knee. 

## 2019-12-15 DIAGNOSIS — M25542 Pain in joints of left hand: Secondary | ICD-10-CM | POA: Diagnosis not present

## 2019-12-15 DIAGNOSIS — S62665S Nondisplaced fracture of distal phalanx of left ring finger, sequela: Secondary | ICD-10-CM | POA: Diagnosis not present

## 2019-12-15 DIAGNOSIS — M25642 Stiffness of left hand, not elsewhere classified: Secondary | ICD-10-CM | POA: Diagnosis not present

## 2019-12-22 ENCOUNTER — Telehealth: Payer: Self-pay

## 2019-12-22 NOTE — Telephone Encounter (Signed)
Patient aware that he is approved for gel injection.  Approved, Monovisc, right knee. [place] deductible has been met Secondary insurance ([employer]) will pick up remaining eligible expenses at 100%. No Co-pay No PA required

## 2019-12-26 ENCOUNTER — Ambulatory Visit: Payer: Medicare Other | Admitting: Family Medicine

## 2019-12-27 ENCOUNTER — Ambulatory Visit (INDEPENDENT_AMBULATORY_CARE_PROVIDER_SITE_OTHER): Payer: Medicare Other | Admitting: Family Medicine

## 2019-12-27 ENCOUNTER — Encounter: Payer: Self-pay | Admitting: Family Medicine

## 2019-12-27 ENCOUNTER — Other Ambulatory Visit: Payer: Self-pay

## 2019-12-27 DIAGNOSIS — M1711 Unilateral primary osteoarthritis, right knee: Secondary | ICD-10-CM | POA: Diagnosis not present

## 2019-12-27 MED ORDER — DICLOFENAC SODIUM 1 % EX GEL
4.0000 g | Freq: Four times a day (QID) | CUTANEOUS | 6 refills | Status: DC | PRN
Start: 2019-12-27 — End: 2020-03-05

## 2019-12-27 NOTE — Progress Notes (Signed)
Subjective: He is here for planned Monovisc injection for right knee osteoarthritis.  Objective: 1+ effusion with no warmth.  Procedure: Right knee Monovisc injection: After sterile prep with Betadine, injected 3 cc 1% lidocaine without epinephrine and Monovisc from lateral midpatellar approach.

## 2019-12-28 ENCOUNTER — Telehealth: Payer: Self-pay | Admitting: Family Medicine

## 2019-12-28 NOTE — Telephone Encounter (Signed)
Patient has a documented history of left knee OA based on prior x-rays.  He's done well for a while, but is now having daily pain.  Will request approval for gel injections.

## 2020-01-03 ENCOUNTER — Telehealth: Payer: Self-pay

## 2020-01-03 NOTE — Telephone Encounter (Signed)
Submitted VOB, Monovisc, left knee. 

## 2020-01-10 ENCOUNTER — Telehealth: Payer: Self-pay

## 2020-01-10 NOTE — Telephone Encounter (Signed)
Yes, that's fine 

## 2020-01-10 NOTE — Telephone Encounter (Signed)
Patient called concerning approval for gel injection.  Advised patient that he had been approved and appointments were available for Thursday, 01/12/2020 and Friday, 01/13/2020, patient then stated that he would call Dr. Junius Roads, so he could get him in sooner.  Approved, Monovisc, Left knee. [place] has been met Secondary insurance ([employer]) will pick up remaining expenses at 100% No Co-pay No PA required

## 2020-01-10 NOTE — Telephone Encounter (Signed)
I called the patient - working him in tomorrow first thing.

## 2020-01-10 NOTE — Telephone Encounter (Signed)
Ok to work him in somewhere?

## 2020-01-11 ENCOUNTER — Ambulatory Visit (INDEPENDENT_AMBULATORY_CARE_PROVIDER_SITE_OTHER): Payer: Medicare Other | Admitting: Family Medicine

## 2020-01-11 ENCOUNTER — Encounter: Payer: Self-pay | Admitting: Family Medicine

## 2020-01-11 ENCOUNTER — Other Ambulatory Visit: Payer: Self-pay

## 2020-01-11 DIAGNOSIS — M1712 Unilateral primary osteoarthritis, left knee: Secondary | ICD-10-CM | POA: Diagnosis not present

## 2020-01-11 NOTE — Progress Notes (Signed)
Subjective: He is here for planned left knee Monovisc injection.  Objective: 1+ effusion with no warmth.  Procedure: Left knee injection: After sterile prep with Betadine, injected 3 cc 1% lidocaine without epinephrine and Monovisc from lateral midpatellar approach.

## 2020-01-18 ENCOUNTER — Encounter: Payer: Self-pay | Admitting: Family Medicine

## 2020-01-18 ENCOUNTER — Other Ambulatory Visit: Payer: Self-pay

## 2020-01-18 ENCOUNTER — Ambulatory Visit (INDEPENDENT_AMBULATORY_CARE_PROVIDER_SITE_OTHER): Payer: Medicare Other | Admitting: Family Medicine

## 2020-01-18 DIAGNOSIS — M1711 Unilateral primary osteoarthritis, right knee: Secondary | ICD-10-CM | POA: Diagnosis not present

## 2020-01-18 NOTE — Progress Notes (Signed)
   Office Visit Note   Patient: Phillip Jackson           Date of Birth: 09/11/1950           MRN: [id number] Visit Date: 01/18/2020 Requested by: Lavada Mesi, MD [address] [place],  [place] [address] PCP: Lavada Mesi, MD  Subjective: Chief Complaint  Patient presents with  . Right Knee - Pain    Knee is painful - hurts to walk.    HPI: He is here with right knee pain.  His left knee did fine with viscosupplementation, but his right knee has been swollen and painful since then.  He rested afterward, he is not sure why it reacted like this.  No fevers or chills.              ROS:   All other systems were reviewed and are negative.  Objective: Vital Signs: There were no vitals taken for this visit.  Physical Exam:  General:  Alert and oriented, in no acute distress. Pulm:  Breathing unlabored. Psy:  Normal mood, congruent affect. Skin: No rash or erythema. Right knee: 1+ effusion.  Full active extension, full flexion.  Tender around the patellofemoral joint.  Imaging: No results found.  Assessment & Plan: 1.  Right knee pain with effusion -Elected to inject with cortisone today.  Follow-up as needed.     Procedures: Right knee steroid injection: After sterile prep with Betadine, injected 5 cc 1% lidocaine without epinephrine and 40 mg methylprednisolone from superolateral approach, a flash of clear yellow synovial fluid was obtained prior to injection to confirm intra-articular placement.    PMFS History: Patient Active Problem List   Diagnosis Date Noted  . Mallet fracture, closed, initial encounter 12/12/2019  . History of nephrolithiasis 07/14/2018  . Unilateral primary osteoarthritis, right knee 07/14/2018  . Idiopathic chronic gout without tophus 07/14/2018  . Kidney stones 08/19/2013  . Hyperlipidemia 06/28/2013   Past Medical History:  Diagnosis Date  . History of kidney stones   . HLD (hyperlipidemia)   . Hx-TIA (transient ischemic attack)  2011   right brain with left arm weakness  . OA (osteoarthritis)     Family History  Problem Relation Age of Onset  . Heart attack Father 28  . Other Father        open heart surgery  . Diabetes Father   . Heart disease Other        uncles on both sides of famiily  . Hypertension Mother   . Diabetes Brother   . Hypertension Maternal Aunt   . Cancer Neg Hx     Past Surgical History:  Procedure Laterality Date  . KNEE SURGERY     multiple  . LEFT HEART CATH  2001  . ORTHOPEDIC SURGERY     several   Social History   Occupational History  . Not on file  Tobacco Use  . Smoking status: Never Smoker  . Smokeless tobacco: Never Used  Substance and Sexual Activity  . Alcohol use: No  . Drug use: No  . Sexual activity: Not on file

## 2020-02-06 ENCOUNTER — Telehealth: Payer: Self-pay | Admitting: Family Medicine

## 2020-02-06 DIAGNOSIS — G8929 Other chronic pain: Secondary | ICD-10-CM

## 2020-02-06 DIAGNOSIS — M1711 Unilateral primary osteoarthritis, right knee: Secondary | ICD-10-CM

## 2020-02-06 NOTE — Telephone Encounter (Signed)
Right knee still not improving even after recent steroid injection.  Will order x-rays and MRI to further evaluate.

## 2020-02-20 ENCOUNTER — Other Ambulatory Visit: Payer: Self-pay

## 2020-02-20 ENCOUNTER — Ambulatory Visit (INDEPENDENT_AMBULATORY_CARE_PROVIDER_SITE_OTHER): Payer: Medicare Other | Admitting: Family Medicine

## 2020-02-20 ENCOUNTER — Encounter: Payer: Self-pay | Admitting: Family Medicine

## 2020-02-20 ENCOUNTER — Ambulatory Visit
Admission: RE | Admit: 2020-02-20 | Discharge: 2020-02-20 | Disposition: A | Discharge status: Home / Self Care | Payer: Medicare Other | Source: Ambulatory Visit | Attending: Family Medicine | Admitting: Family Medicine

## 2020-02-20 DIAGNOSIS — M25561 Pain in right knee: Secondary | ICD-10-CM | POA: Diagnosis not present

## 2020-02-20 DIAGNOSIS — G8929 Other chronic pain: Secondary | ICD-10-CM

## 2020-02-20 DIAGNOSIS — M1711 Unilateral primary osteoarthritis, right knee: Secondary | ICD-10-CM

## 2020-02-20 DIAGNOSIS — M23231 Derangement of other medial meniscus due to old tear or injury, right knee: Secondary | ICD-10-CM | POA: Diagnosis not present

## 2020-02-20 NOTE — Progress Notes (Signed)
Office Visit Note   Patient: Phillip Jackson           Date of Birth: 15-Feb-1951           MRN: [id number] Visit Date: 02/20/2020 Requested by: Lavada Mesi, MD [address] [place],  [place] [address] PCP: Lavada Mesi, MD  Subjective: Chief Complaint  Patient presents with  . Right Hip - Pain    HPI: Here for follow-up right knee pain.  Unfortunately he continues to have substantial pain.  The knee has given out several times, when he puts his weight on it and pivots he feels severe pain on the medial aspect.  It caused him to fall the other day.  Gel injection did not help, in fact it made his pain worse.  Cortisone injection afterward did not provide adequate relief.  Pain is mostly on the medial aspect.  He had his MRI scan this morning.              ROS:   All other systems were reviewed and are negative.  Objective: Vital Signs: There were no vitals taken for this visit.  Physical Exam:  General:  Alert and oriented, in no acute distress. Pulm:  Breathing unlabored. Psy:  Normal mood, congruent affect. Skin: No erythema Right knee: Trace effusion with no warmth.  Pain with full extension.  Exquisitely tender on the medial joint line, pain and a palpable click with McMurray's.  Ligaments feel stable.  Imaging: MR Knee Right w/o contrast  Result Date: 02/20/2020 CLINICAL DATA:  Chronic right knee pain. Reported history of prior right knee surgery in 2000. EXAM: MRI OF THE RIGHT KNEE WITHOUT CONTRAST TECHNIQUE: Multiplanar, multisequence MR imaging of the knee was performed. No intravenous contrast was administered. COMPARISON:  None. FINDINGS: MENISCI Medial meniscus: Complex tear of the anterior horn of the medial meniscus with prominent oblique component extending to the inferior articular surface (series 7, images 15-20). Tear involves the anterior root attachment. Lateral meniscus: Horizontal tear of the anterior horn of the lateral meniscus (series 7, images 9-10;  series 6, image 16). Tear appears to involve the anterior root attachment. LIGAMENTS Cruciates:  Intact ACL and PCL. Collaterals: Medial collateral ligament is intact. Lateral collateral ligament complex is intact. CARTILAGE Patellofemoral: Mild surface irregularity without focal chondral defect. Medial: Moderate partial-thickness cartilage loss of the medial femorotibial compartment. Lateral:  No chondral defect. Joint:  Trace joint effusion.  Mild edema within Hoffa's fat. Popliteal Fossa:  Distal popliteus tendinosis.  No Baker's cyst. Extensor Mechanism:  Intact quadriceps tendon and patellar tendon. Bones: No focal marrow signal abnormality. No fracture or dislocation. Other: None. IMPRESSION: 1. Complex tear of the anterior horn of the medial meniscus with involvement of the anterior root attachment. 2. Horizontal tear of the anterior horn of the lateral meniscus with likely involvement of the anterior root attachment. 3. Moderate partial-thickness cartilage loss of the medial femorotibial compartment. 4. Distal popliteus tendinosis. Electronically Signed   By: Duanne Guess D.O.   On: 02/20/2020 08:28    Assessment & Plan: 1.  Right knee degenerative medial meniscus tear -I will refer him to Dr. August Saucer for consideration of arthroscopic debridement.  He will follow-up based on his recommendations.  He understands that because of the underlying arthritis in the joint, sometimes arthroscopic debridement can lead to increasing pain from arthritis.     Procedures: No procedures performed  No notes on file     PMFS History: Patient Active Problem List   Diagnosis  Date Noted  . Mallet fracture, closed, initial encounter 12/12/2019  . History of nephrolithiasis 07/14/2018  . Unilateral primary osteoarthritis, right knee 07/14/2018  . Idiopathic chronic gout without tophus 07/14/2018  . Kidney stones 08/19/2013  . Hyperlipidemia 06/28/2013   Past Medical History:  Diagnosis Date  . History  of kidney stones   . HLD (hyperlipidemia)   . 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 OA (osteoarthritis)     Family History  Problem Relation Age of Onset  . Heart attack Father 60  . Other Father        open heart surgery  . Diabetes Father   . Heart disease Other        uncles on both sides of famiily  . Hypertension Mother   . Diabetes Brother   . Hypertension Maternal Aunt   . Cancer Neg Hx     Past Surgical History:  Procedure Laterality Date  . KNEE SURGERY     multiple  . LEFT HEART CATH  2001  . ORTHOPEDIC SURGERY     several   Social History   Occupational History  . Not on file  Tobacco Use  . Smoking status: Never Smoker  . Smokeless tobacco: Never Used  Substance and Sexual Activity  . Alcohol use: No  . Drug use: No  . Sexual activity: Not on file

## 2020-02-20 NOTE — Progress Notes (Signed)
Appt scheduled

## 2020-02-21 ENCOUNTER — Encounter: Payer: Self-pay | Admitting: Family Medicine

## 2020-02-27 ENCOUNTER — Encounter: Payer: Self-pay | Admitting: Family Medicine

## 2020-03-05 ENCOUNTER — Other Ambulatory Visit: Payer: Self-pay | Admitting: Family Medicine

## 2020-03-05 MED ORDER — ALLOPURINOL 100 MG PO TABS: 100.0000 mg | ORAL_TABLET | Freq: Two times a day (BID) | ORAL | 3 refills | Status: DC

## 2020-03-05 MED ORDER — DICLOFENAC SODIUM 1 % EX GEL
4.0000 g | Freq: Four times a day (QID) | CUTANEOUS | 11 refills | Status: DC | PRN
Start: 2020-03-05 — End: 2021-11-05

## 2020-03-05 MED ORDER — COLCHICINE 0.6 MG PO CAPS
1.0000 | ORAL_CAPSULE | Freq: Every day | ORAL | 3 refills | Status: DC
Start: 2020-03-05 — End: 2020-09-25

## 2020-03-05 NOTE — Progress Notes (Signed)
Placed downstairs at the left-side check-in desk.

## 2020-03-08 ENCOUNTER — Ambulatory Visit (INDEPENDENT_AMBULATORY_CARE_PROVIDER_SITE_OTHER): Payer: Medicare Other | Admitting: Orthopedic Surgery

## 2020-03-08 DIAGNOSIS — M1711 Unilateral primary osteoarthritis, right knee: Secondary | ICD-10-CM | POA: Diagnosis not present

## 2020-03-11 ENCOUNTER — Encounter: Payer: Self-pay | Admitting: Orthopedic Surgery

## 2020-03-11 NOTE — Progress Notes (Signed)
Office Visit Note   Patient: Phillip Jackson           Date of Birth: Jul 06, 1951           MRN: [id number] Visit Date: 03/08/2020 Requested by: Phillip Mesi, MD [address] [place],  [place] [address] PCP: Phillip Mesi, MD  Subjective: Chief Complaint  Patient presents with  . Right Knee - Pain    HPI: Phillip Jackson is a 69 year old patient with right knee pain.  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  He may be getting an increase in his disability from the [place].  Ibuprofen is not helpful.  2 months ago he had six a Synvisc flare in his right knee and cortisone after that helped for about 3 days.  Reports continued pain in the knee but it is not debilitating.  Does limit his walking endurance.  No night pain that wakes him from sleep at night.  He is active in deer season which ends in January.  MRI scan from 02/20/2020 is reviewed.  Shows medial and lateral meniscal tears with mild patellofemoral wear and tear significant medial compartment arthritis and no chondral defect on the lateral side.              ROS: All systems reviewed are negative as they relate to the chief complaint within the history of present illness.  Patient denies  fevers or chills.   Assessment & Plan: Visit Diagnoses:  1. Unilateral primary osteoarthritis, right knee     Plan: Impression is right knee arthritis.  Looks to be primarily medial compartment arthritis.  ACL is intact.  Although he is got some lateral meniscal pathology the patellofemoral and lateral compartment articular cartilage appears to be spared.  [name] may be a candidate for partial knee replacement when the time comes.  Based on the amount of arthritis that is present as well as the absence of mechanical symptoms in the loss of joint space noted on the coronal views and sections in the MRI scan can I think he is looking at a nonarthroscopic surgical treatment option if his symptoms worsen.  He is  going to consider his options.  We discussed a lot about knee replacement partial versus complete.  He may have too much varus deformity for the partial knee replacement but will assess that closer to the time when he is considering intervention.  Follow-Up Instructions: Return if symptoms worsen or fail to improve.   Orders:  No orders of the defined types were placed in this encounter.  No orders of the defined types were placed in this encounter.     Procedures: No procedures performed   Clinical Data: No additional findings.  Objective: Vital Signs: There were no vitals taken for this visit.  Physical Exam:   Constitutional: Patient appears well-developed HEENT:  Head: Normocephalic Eyes:EOM are normal Neck: Normal range of motion Cardiovascular: Normal rate Pulmonary/chest: Effort normal Neurologic: Patient is alert Skin: Skin is warm Psychiatric: Patient has normal mood and affect    Ortho Exam: Ortho exam demonstrates varus alignment bilateral lower extremities.  Collateral crucial ligaments are stable on the right-hand side.  Range of motion lacks about 5 to 8 degrees from full extension on the right but does have full extension on the left.  He is able to flex past ninety.  Collaterals are stable.  Has medial greater than lateral joint line tenderness.  No masses lymphadenopathy or skin changes noted in that  right knee region  Specialty Comments:  No specialty comments available.  Imaging: No results found.   PMFS History: Patient Active Problem List   Diagnosis Date Noted  . Mallet fracture, closed, initial encounter 12/12/2019  . History of nephrolithiasis 07/14/2018  . Unilateral primary osteoarthritis, right knee 07/14/2018  . Idiopathic chronic gout without tophus 07/14/2018  . Kidney stones 08/19/2013  . Hyperlipidemia 06/28/2013   Past Medical History:  Diagnosis Date  . History of kidney stones   . HLD (hyperlipidemia)   . Hx-TIA (transient  ischemic attack) 2011   right brain with left arm weakness  . OA (osteoarthritis)     Family History  Problem Relation Age of Onset  . Heart attack Father 60  . Other Father        open heart surgery  . Diabetes Father   . Heart disease Other        uncles on both sides of famiily  . Hypertension Mother   . Diabetes Brother   . Hypertension Maternal Aunt   . Cancer Neg Hx     Past Surgical History:  Procedure Laterality Date  . KNEE SURGERY     multiple  . LEFT HEART CATH  2001  . ORTHOPEDIC SURGERY     several   Social History   Occupational History  . Not on file  Tobacco Use  . Smoking status: Never Smoker  . Smokeless tobacco: Never Used  Substance and Sexual Activity  . Alcohol use: No  . Drug use: No  . Sexual activity: Not on file

## 2020-04-11 DIAGNOSIS — Z23 Encounter for immunization: Secondary | ICD-10-CM | POA: Diagnosis not present

## 2020-04-23 ENCOUNTER — Telehealth: Payer: Self-pay | Admitting: Family Medicine

## 2020-04-23 DIAGNOSIS — R42 Dizziness and giddiness: Secondary | ICD-10-CM

## 2020-04-23 NOTE — Telephone Encounter (Signed)
Phillip Jackson is having increasing problems with dizziness.  Will request neurology consult.

## 2020-07-09 ENCOUNTER — Ambulatory Visit: Payer: Medicare Other | Admitting: Neurology

## 2020-07-10 ENCOUNTER — Ambulatory Visit: Payer: Self-pay | Admitting: Neurology

## 2020-07-10 ENCOUNTER — Ambulatory Visit: Payer: Medicare Other | Admitting: Diagnostic Neuroimaging

## 2020-07-16 ENCOUNTER — Ambulatory Visit: Payer: Medicare Other | Admitting: Neurology

## 2020-07-18 ENCOUNTER — Telehealth: Payer: Self-pay | Admitting: Family Medicine

## 2020-07-18 DIAGNOSIS — R0602 Shortness of breath: Secondary | ICD-10-CM | POA: Diagnosis not present

## 2020-07-18 DIAGNOSIS — R079 Chest pain, unspecified: Secondary | ICD-10-CM | POA: Diagnosis not present

## 2020-07-18 DIAGNOSIS — I498 Other specified cardiac arrhythmias: Secondary | ICD-10-CM | POA: Diagnosis not present

## 2020-07-18 DIAGNOSIS — Z20822 Contact with and (suspected) exposure to covid-19: Secondary | ICD-10-CM | POA: Diagnosis not present

## 2020-07-18 DIAGNOSIS — I444 Left anterior fascicular block: Secondary | ICD-10-CM | POA: Diagnosis not present

## 2020-07-18 DIAGNOSIS — I252 Old myocardial infarction: Secondary | ICD-10-CM | POA: Diagnosis not present

## 2020-07-18 NOTE — Telephone Encounter (Signed)
Phillip Jackson contacted me and said he woke up at 3 am with shortness of breath and sensation that elephant was sitting on his chest.  I told him he should go immediately to ER or urgent care for cardiac workup.

## 2020-07-31 ENCOUNTER — Encounter: Payer: Medicare Other | Admitting: Diagnostic Neuroimaging

## 2020-07-31 NOTE — Progress Notes (Deleted)
[facility]  PATIENT: Phillip Jackson DOB: 1950-07-13  REFERRING CLINICIAN: Lavada Mesi, MD HISTORY FROM: *** REASON FOR VISIT: ***   HISTORICAL  CHIEF COMPLAINT:  No chief complaint on file.   HISTORY OF PRESENT ILLNESS:  ***  REVIEW OF SYSTEMS: Full 14 system review of systems performed and negative with exception of: ***  ALLERGIES: Allergies  Allergen Reactions  . Codeine     REACTION: hallucinations    HOME MEDICATIONS: Outpatient Medications Prior to Visit  Medication Sig Dispense Refill  . allopurinol (ZYLOPRIM) 100 MG tablet Take 1 tablet (100 mg total) by mouth 2 (two) times daily. TAKE 1 TABLET EVERY DAY - office visit before next refill 180 tablet 3  . baclofen (LIORESAL) 10 MG tablet Take 0.5-1 tablets (5-10 mg total) by mouth 3 (three) times daily as needed for muscle spasms. (Patient not taking: Reported on 02/20/2020) 30 each 3  . Cholecalciferol (VITAMIN D3 PO) Take by mouth.    . Colchicine 0.6 MG CAPS Take 1 tablet by mouth daily. 90 capsule 3  . diclofenac Sodium (VOLTAREN) 1 % GEL Apply 4 g topically 4 (four) times daily as needed. 500 g 11  . Magnesium 100 MG CAPS Take by mouth daily.    . methocarbamol (ROBAXIN) 500 MG tablet Take 1 tablet (500 mg total) by mouth every 6 (six) hours as needed for muscle spasms. (Patient not taking: Reported on 02/20/2020) 120 tablet 3   No facility-administered medications prior to visit.    PAST MEDICAL HISTORY: Past Medical History:  Diagnosis Date  . History of kidney stones   . HLD (hyperlipidemia)   . 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 OA (osteoarthritis)     PAST SURGICAL HISTORY: Past Surgical History:  Procedure Laterality Date  . KNEE SURGERY     multiple  . LEFT HEART CATH  2001  . ORTHOPEDIC SURGERY     several    FAMILY HISTORY: Family History  Problem Relation Age of Onset  . Heart attack Father 49  . Other Father         open heart surgery  . Diabetes Father   . Heart disease Other        uncles on both sides of famiily  . Hypertension Mother   . Diabetes Brother   . Hypertension Maternal Aunt   . Cancer Neg Hx     SOCIAL HISTORY: Social History   Socioeconomic History  . Marital status: Married    Spouse name: Not on file  . Number of children: Not on file  . Years of education: Not on file  . Highest education level: Not on file  Occupational History  . Not on file  Tobacco Use  . Smoking status: Never Smoker  . Smokeless tobacco: Never Used  Substance and Sexual Activity  . Alcohol use: No  . Drug use: No  . Sexual activity: Not on file  Other Topics Concern  . Not on file  Social History Narrative  . Not on file   Social Determinants of Health   Financial Resource Strain: Not on file  Food Insecurity: Not on file  Transportation Needs: Not on file  Physical Activity: Not on file  Stress: Not on file  Social Connections: Not on file  Intimate Partner Violence: Not on file     PHYSICAL EXAM ***  GENERAL EXAM/CONSTITUTIONAL:  Vitals: There were no vitals filed for this visit.  There  is no height or weight on file to calculate BMI. Wt Readings from Last 3 Encounters:  07/18/19 212 lb (96.2 kg)  07/15/19 212 lb (96.2 kg)  08/16/18 205 lb (93 kg)     Patient is in no distress; well developed, nourished and groomed; neck is supple  CARDIOVASCULAR:  Examination of carotid arteries is normal; no carotid bruits  Regular rate and rhythm, no murmurs  Examination of peripheral vascular system by observation and palpation is normal  EYES:  Ophthalmoscopic exam of optic discs and posterior segments is normal; no papilledema or hemorrhages  No exam data present  MUSCULOSKELETAL:  Gait, strength, tone, movements noted in Neurologic exam below  NEUROLOGIC: MENTAL STATUS:  No flowsheet data found.  awake, alert, oriented to person, place and time  recent  and remote memory intact  normal attention and concentration  language fluent, comprehension intact, naming intact  fund of knowledge appropriate  CRANIAL NERVE:   2nd - no papilledema on fundoscopic exam  2nd, 3rd, 4th, 6th - pupils equal and reactive to light, visual fields full to confrontation, extraocular muscles intact, no nystagmus  5th - facial sensation symmetric  7th - facial strength symmetric  8th - hearing intact  9th - palate elevates symmetrically, uvula midline  11th - shoulder shrug symmetric  12th - tongue protrusion midline  MOTOR:   normal bulk and tone, full strength in the BUE, BLE  SENSORY:   normal and symmetric to light touch, pinprick, temperature, vibration  COORDINATION:   finger-nose-finger, fine finger movements normal  REFLEXES:   deep tendon reflexes present and symmetric  GAIT/STATION:   narrow based gait; able to walk on toes, heels and tandem; romberg is negative     DIAGNOSTIC DATA (LABS, IMAGING, TESTING) - I reviewed patient records, labs, notes, testing and imaging myself where available.  Lab Results  Component Value Date   WBC 7.4 07/18/2019   HGB 16.4 07/18/2019   HCT 50.1 (H) 07/18/2019   MCV 91.4 07/18/2019   PLT 366 07/18/2019      Component Value Date/Time   NA 140 07/18/2019 1353   K 5.2 07/18/2019 1353   CL 105 07/18/2019 1353   CO2 25 07/18/2019 1353   GLUCOSE 75 07/18/2019 1353   BUN 33 (H) 07/18/2019 1353   CREATININE 1.48 (H) 07/18/2019 1353   CALCIUM 9.9 07/18/2019 1353   PROT 7.5 07/18/2019 1353   AST 24 07/18/2019 1353   ALT 24 07/18/2019 1353   BILITOT 0.5 07/18/2019 1353   Lab Results  Component Value Date   CHOL 205 (H) 07/18/2019   HDL 54 07/18/2019   LDLCALC 137 (H) 07/18/2019   TRIG 50 07/18/2019   CHOLHDL 3.8 07/18/2019   No results found for: HGBA1C No results found for: VITAMINB12 Lab Results  Component Value Date   TSH 2.73 07/14/2018    ***    ASSESSMENT AND  PLAN  70 y.o. year old male here with ***  Localization:  Ddx:  No diagnosis found.    PLAN: ***  No orders of the defined types were placed in this encounter.   No orders of the defined types were placed in this encounter.   No follow-ups on file.    Suanne Marker, MD 07/31/2020, 2:43 PM Certified in Neurology, Neurophysiology and Neuroimaging  [facility] [address], [address] [place], [place] [address] [phone]

## 2020-08-03 ENCOUNTER — Telehealth: Payer: Self-pay | Admitting: Family Medicine

## 2020-08-03 NOTE — Telephone Encounter (Signed)
FYI I Called and worked pt in with dr. August Saucer on Wednesday

## 2020-08-03 NOTE — Telephone Encounter (Signed)
Thank you :)

## 2020-08-03 NOTE — Telephone Encounter (Signed)
Can you get him scheduled to see August Saucer?

## 2020-08-03 NOTE — Telephone Encounter (Signed)
Patient called stating that his knee pain has gotten substantially worse, he cannot sleep at night and he is using a cane for ambulation.  I recommended coming back in to see Dr. August Saucer to discuss surgical options, so I will arrange this for him.

## 2020-08-08 ENCOUNTER — Ambulatory Visit (INDEPENDENT_AMBULATORY_CARE_PROVIDER_SITE_OTHER): Payer: Medicare Other | Admitting: Orthopedic Surgery

## 2020-08-08 ENCOUNTER — Other Ambulatory Visit: Payer: Self-pay

## 2020-08-08 DIAGNOSIS — M1711 Unilateral primary osteoarthritis, right knee: Secondary | ICD-10-CM

## 2020-08-10 ENCOUNTER — Encounter: Payer: Self-pay | Admitting: Orthopedic Surgery

## 2020-08-10 NOTE — Progress Notes (Signed)
Office Visit Note   Patient: Phillip Jackson           Date of Birth: [birth year]           MRN: [id number] Visit Date: 08/08/2020 Requested by: Lavada Mesi, MD [address] [place],  [place] [address] PCP: Lavada Mesi, MD  Subjective: Chief Complaint  Patient presents with  . Other     Discuss surgery options    HPI: Phillip Jackson is an active 70 year old patient with right knee pain.  He has had gel injections which do not help.  He has been taking Tylenol and using ice for his knee which has improved some.  He continues to try to walk 2 miles a day to keep his muscle tone up.  He watched his father have decreased muscle tone and wants to prevent that from happening with him.  All of his pain is on the medial aspect of the right knee.  He is doing recumbent bike for fitness.  He is able to hunt and climb trees with his knee is feeling good which is generally most of the time until the last 1 to 2 weeks.  He has had an MRI scan from last year which does show medial compartment arthritis only.  His ACL is intact.              ROS: All systems reviewed are negative as they relate to the chief complaint within the history of present illness.  Patient denies  fevers or chills.   Assessment & Plan: Visit Diagnoses:  1. Unilateral primary osteoarthritis, right knee     Plan: Impression is right knee pain in a very functional and active 70 year old patient.  He does have varus alignment but enough laxity on the medial side that unicompartmental knee replacement could be considered when his symptoms become severe.  Currently he is able to maintain a relatively high level of function.  I recommended that he stop walking for exercise and continue with the stationary bike.  I think he is heading for unicompartmental knee replacement but is not quite there yet in terms of the amount of disability that his knee is giving him.  Plan to see him back in 4 months for clinical recheck and further  discussion about intervention  Follow-Up Instructions: Return in about 4 months (around 12/06/2020).   Orders:  No orders of the defined types were placed in this encounter.  No orders of the defined types were placed in this encounter.     Procedures: No procedures performed   Clinical Data: No additional findings.  Objective: Vital Signs: There were no vitals taken for this visit.  Physical Exam:   Constitutional: Patient appears well-developed HEENT:  Head: Normocephalic Eyes:EOM are normal Neck: Normal range of motion Cardiovascular: Normal rate Pulmonary/chest: Effort normal Neurologic: Patient is alert Skin: Skin is warm Psychiatric: Patient has normal mood and affect    Ortho Exam: Ortho exam demonstrates mild varus alignment with palpable pedal pulses.  He does have near full extension bilaterally with flexion to about 125.  He is got enough laxity to valgus stress at 15 degrees that unicompartmental knee replacement could be considered.  ACL is stable on the right-hand side.  Gait is nonantalgic.  No groin pain with internal or external rotation of the right leg.  Specialty Comments:  No specialty comments available.  Imaging: No results found.   PMFS History: Patient Active Problem List   Diagnosis Date Noted  . Mallet fracture, closed,  initial encounter 12/12/2019  . History of nephrolithiasis 07/14/2018  . Unilateral primary osteoarthritis, right knee 07/14/2018  . Idiopathic chronic gout without tophus 07/14/2018  . Kidney stones 08/19/2013  . Hyperlipidemia 06/28/2013   Past Medical History:  Diagnosis Date  . History of kidney stones   . HLD (hyperlipidemia)   . 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 OA (osteoarthritis)     Family History  Problem Relation Age of Onset  . Heart attack Father 82  . Other Father        open heart surgery  . Diabetes Father   . Heart disease Other        uncles on  both sides of famiily  . Hypertension Mother   . Diabetes Brother   . Hypertension Maternal Aunt   . Cancer Neg Hx     Past Surgical History:  Procedure Laterality Date  . KNEE SURGERY     multiple  . LEFT HEART CATH  2001  . ORTHOPEDIC SURGERY     several   Social History   Occupational History  . Not on file  Tobacco Use  . Smoking status: Never Smoker  . Smokeless tobacco: Never Used  Substance and Sexual Activity  . Alcohol use: No  . Drug use: No  . Sexual activity: Not on file

## 2020-08-16 NOTE — Progress Notes (Signed)
This encounter was created in error - please disregard.

## 2020-08-30 ENCOUNTER — Encounter: Payer: Self-pay | Admitting: Family Medicine

## 2020-08-30 ENCOUNTER — Ambulatory Visit (INDEPENDENT_AMBULATORY_CARE_PROVIDER_SITE_OTHER): Payer: Medicare Other | Admitting: Family Medicine

## 2020-08-30 ENCOUNTER — Other Ambulatory Visit: Payer: Self-pay

## 2020-08-30 DIAGNOSIS — M25552 Pain in left hip: Secondary | ICD-10-CM | POA: Diagnosis not present

## 2020-08-30 NOTE — Progress Notes (Signed)
° °  Office Visit Note   Patient: Phillip Jackson           Date of Birth: 03-07-1951           MRN: [id number] Visit Date: 08/30/2020 Requested by: Lavada Mesi, MD [address] [place],  [place] [address] PCP: Lavada Mesi, MD  Subjective: No chief complaint on file.   HPI: He is here with left lateral hip pain.  Symptoms started 2 days ago, no definite injury.  It woke him in the middle the night with severe pain, now he hurts anytime he moves his leg at all.  No radicular symptoms.  No fever or chills.  He does have a history of gout.                ROS:   All other systems were reviewed and are negative.  Objective: Vital Signs: There were no vitals taken for this visit.  Physical Exam:  General:  Alert and oriented, in no acute distress. Pulm:  Breathing unlabored. Psy:  Normal mood, congruent affect. Skin: No rash Left hip: Exquisite tenderness over the greater trochanter which seems to reproduce his pain.  No pain with internal hip rotation.  Straight leg raise is negative.    Imaging: No results found.  Assessment & Plan: 1.  Left hip pain, suspect bursitis with possible gout involvement. -Elected to inject with cortisone.  He will try taking gout medicine as well.  Follow-up as needed.     Procedures: Left hip injection: After sterile prep with Betadine, injected 5 cc 0.25% bupivacaine and 6 mg betamethasone.  He had excellent immediate relief.       PMFS History: Patient Active Problem List   Diagnosis Date Noted   Mallet fracture, closed, initial encounter 12/12/2019   History of nephrolithiasis 07/14/2018   Unilateral primary osteoarthritis, right knee 07/14/2018   Idiopathic chronic gout without tophus 07/14/2018   Kidney stones 08/19/2013   Hyperlipidemia 06/28/2013   Past Medical History:  Diagnosis Date   History of kidney stones    HLD (hyperlipidemia)    Hx-TIA (transient ischemic attack) 2011   right brain with left arm  weakness   OA (osteoarthritis)     Family History  Problem Relation Age of Onset   Heart attack Father 22   Other Father        open heart surgery   Diabetes Father    Heart disease Other        uncles on both sides of famiily   Hypertension Mother    Diabetes Brother    Hypertension Maternal Aunt    Cancer Neg Hx     Past Surgical History:  Procedure Laterality Date   KNEE SURGERY     multiple   LEFT HEART CATH  2001   ORTHOPEDIC SURGERY     several   Social History   Occupational History   Not on file  Tobacco Use   Smoking status: Never Smoker   Smokeless tobacco: Never Used  Substance and Sexual Activity   Alcohol use: No   Drug use: No   Sexual activity: Not on file

## 2020-09-05 ENCOUNTER — Encounter: Payer: Self-pay | Admitting: Family Medicine

## 2020-09-05 LAB — COLOGUARD

## 2020-09-13 ENCOUNTER — Telehealth: Payer: Self-pay | Admitting: Family Medicine

## 2020-09-13 NOTE — Telephone Encounter (Signed)
Emailed pt authorization form. Advised he can email back to me or bring in to the office.

## 2020-09-25 ENCOUNTER — Other Ambulatory Visit: Payer: Self-pay | Admitting: Family Medicine

## 2020-09-25 MED ORDER — COLCHICINE 0.6 MG PO CAPS: 1.0000 | ORAL_CAPSULE | Freq: Two times a day (BID) | ORAL | 3 refills | Status: AC | PRN

## 2020-10-04 DIAGNOSIS — H5319 Other subjective visual disturbances: Secondary | ICD-10-CM | POA: Diagnosis not present

## 2020-10-04 DIAGNOSIS — H43811 Vitreous degeneration, right eye: Secondary | ICD-10-CM | POA: Diagnosis not present

## 2020-10-04 DIAGNOSIS — H2513 Age-related nuclear cataract, bilateral: Secondary | ICD-10-CM | POA: Diagnosis not present

## 2020-10-04 DIAGNOSIS — H40002 Preglaucoma, unspecified, left eye: Secondary | ICD-10-CM | POA: Diagnosis not present

## 2020-10-22 ENCOUNTER — Encounter: Payer: Self-pay | Admitting: Family Medicine

## 2020-11-08 DIAGNOSIS — B078 Other viral warts: Secondary | ICD-10-CM | POA: Diagnosis not present

## 2020-11-08 DIAGNOSIS — L821 Other seborrheic keratosis: Secondary | ICD-10-CM | POA: Diagnosis not present

## 2020-11-08 DIAGNOSIS — D692 Other nonthrombocytopenic purpura: Secondary | ICD-10-CM | POA: Diagnosis not present

## 2020-11-08 DIAGNOSIS — Z789 Other specified health status: Secondary | ICD-10-CM | POA: Diagnosis not present

## 2020-11-26 ENCOUNTER — Other Ambulatory Visit: Payer: Self-pay | Admitting: Family Medicine

## 2020-11-26 MED ORDER — METHYLPREDNISOLONE 4 MG PO TBPK: ORAL_TABLET | ORAL | 0 refills | Status: DC

## 2020-11-28 ENCOUNTER — Other Ambulatory Visit: Payer: Self-pay | Admitting: Family Medicine

## 2020-11-28 ENCOUNTER — Ambulatory Visit: Payer: Medicare Other | Admitting: Family Medicine

## 2020-11-28 DIAGNOSIS — E785 Hyperlipidemia, unspecified: Secondary | ICD-10-CM

## 2020-11-28 DIAGNOSIS — R7982 Elevated C-reactive protein (CRP): Secondary | ICD-10-CM

## 2020-11-28 DIAGNOSIS — E559 Vitamin D deficiency, unspecified: Secondary | ICD-10-CM

## 2020-11-28 DIAGNOSIS — R030 Elevated blood-pressure reading, without diagnosis of hypertension: Secondary | ICD-10-CM

## 2020-11-28 DIAGNOSIS — Z125 Encounter for screening for malignant neoplasm of prostate: Secondary | ICD-10-CM | POA: Diagnosis not present

## 2020-11-28 DIAGNOSIS — M1A00X0 Idiopathic chronic gout, unspecified site, without tophus (tophi): Secondary | ICD-10-CM

## 2020-11-28 DIAGNOSIS — Z0000 Encounter for general adult medical examination without abnormal findings: Secondary | ICD-10-CM | POA: Diagnosis not present

## 2020-11-28 NOTE — Progress Notes (Signed)
Having frequent gout flares.  Will check uric acid today.

## 2020-11-29 ENCOUNTER — Ambulatory Visit (INDEPENDENT_AMBULATORY_CARE_PROVIDER_SITE_OTHER): Payer: Medicare Other | Admitting: Orthopedic Surgery

## 2020-11-29 ENCOUNTER — Telehealth: Payer: Self-pay | Admitting: Family Medicine

## 2020-11-29 ENCOUNTER — Other Ambulatory Visit: Payer: Self-pay

## 2020-11-29 ENCOUNTER — Other Ambulatory Visit: Payer: Self-pay | Admitting: Family Medicine

## 2020-11-29 ENCOUNTER — Encounter: Payer: Self-pay | Admitting: Orthopedic Surgery

## 2020-11-29 ENCOUNTER — Telehealth: Payer: Self-pay

## 2020-11-29 VITALS — Ht 71.0 in | Wt 195.0 lb

## 2020-11-29 DIAGNOSIS — M1A00X0 Idiopathic chronic gout, unspecified site, without tophus (tophi): Secondary | ICD-10-CM

## 2020-11-29 DIAGNOSIS — R7989 Other specified abnormal findings of blood chemistry: Secondary | ICD-10-CM

## 2020-11-29 DIAGNOSIS — M1711 Unilateral primary osteoarthritis, right knee: Secondary | ICD-10-CM

## 2020-11-29 LAB — LIPID PANEL
Cholesterol: 232 mg/dL — ABNORMAL HIGH (ref <200)
HDL: 63 mg/dL (ref >=40)
LDL Cholesterol (Calc): 146 mg/dL (calc) — ABNORMAL HIGH
Non-HDL Cholesterol (Calc): 169 mg/dL (calc) — ABNORMAL HIGH (ref <130)
Total CHOL/HDL Ratio: 3.7 (calc) (ref <5.0)
Triglycerides: 114 mg/dL (ref <150)

## 2020-11-29 LAB — CBC WITH DIFFERENTIAL/PLATELET
Absolute Monocytes: 994 cells/uL — ABNORMAL HIGH (ref 200–950)
Basophils Absolute: 76 cells/uL (ref 0–200)
Basophils Relative: 0.7 %
Eosinophils Absolute: 97 cells/uL (ref 15–500)
Eosinophils Relative: 0.9 %
HCT: 52.5 % — ABNORMAL HIGH (ref 38.5–50.0)
Hemoglobin: 16.9 g/dL (ref 13.2–17.1)
Lymphs Abs: 3488 cells/uL (ref 850–3900)
MCH: 29.1 pg (ref 27.0–33.0)
MCHC: 32.2 g/dL (ref 32.0–36.0)
MCV: 90.5 fL (ref 80.0–100.0)
MPV: 9.4 fL (ref 7.5–12.5)
Monocytes Relative: 9.2 %
Neutro Abs: 6145 cells/uL (ref 1500–7800)
Neutrophils Relative %: 56.9 %
Platelets: 385 10*3/uL (ref 140–400)
RBC: 5.8 10*6/uL (ref 4.20–5.80)
RDW: 12.9 % (ref 11.0–15.0)
Total Lymphocyte: 32.3 %
WBC: 10.8 10*3/uL (ref 3.8–10.8)

## 2020-11-29 LAB — URIC ACID: Uric Acid, Serum: 6 mg/dL (ref 4.0–8.0)

## 2020-11-29 LAB — COMPREHENSIVE METABOLIC PANEL
AG Ratio: 1.4 (calc) (ref 1.0–2.5)
ALT: 21 U/L (ref 9–46)
AST: 20 U/L (ref 10–35)
Albumin: 4.6 g/dL (ref 3.6–5.1)
Alkaline phosphatase (APISO): 57 U/L (ref 35–144)
BUN/Creatinine Ratio: 15 (calc) (ref 6–22)
BUN: 26 mg/dL — ABNORMAL HIGH (ref 7–25)
CO2: 23 mmol/L (ref 20–32)
Calcium: 10.4 mg/dL — ABNORMAL HIGH (ref 8.6–10.3)
Chloride: 106 mmol/L (ref 98–110)
Creat: 1.74 mg/dL — ABNORMAL HIGH (ref 0.70–1.18)
Globulin: 3.3 g/dL (calc) (ref 1.9–3.7)
Glucose, Bld: 95 mg/dL (ref 65–99)
Potassium: 4 mmol/L (ref 3.5–5.3)
Sodium: 141 mmol/L (ref 135–146)
Total Bilirubin: 0.6 mg/dL (ref 0.2–1.2)
Total Protein: 7.9 g/dL (ref 6.1–8.1)

## 2020-11-29 LAB — VITAMIN D 25 HYDROXY (VIT D DEFICIENCY, FRACTURES): Vit D, 25-Hydroxy: 37 ng/mL (ref 30–100)

## 2020-11-29 LAB — PSA: PSA: 1.31 ng/mL (ref <=4.00)

## 2020-11-29 LAB — TSH: TSH: 4.55 mIU/L — ABNORMAL HIGH (ref 0.40–4.50)

## 2020-11-29 LAB — HIGH SENSITIVITY CRP: hs-CRP: 1 mg/L

## 2020-11-29 MED ORDER — FEBUXOSTAT 40 MG PO TABS: 40.0000 mg | ORAL_TABLET | Freq: Every day | ORAL | 6 refills | Status: DC

## 2020-11-29 NOTE — Progress Notes (Signed)
Office Visit Note   Patient: Phillip Jackson           Date of Birth: 1950/09/10           MRN: [id number] Visit Date: 11/29/2020 Requested by: Lavada Mesi, MD [address] [place],  [place] [address] PCP: Lavada Mesi, MD  Subjective: Chief Complaint  Patient presents with  . Right Knee - Pain, Follow-up    HPI: Daison is a 70 year old patient with right knee medial compartment arthritis.  He recently put down 2000 square feet of hardwood floors.  Put out about 100 stones and a stone block wall.  Gout is more of a problem for him at this time.  He would like to be active for deer season which starts in September runs until January.  He has lost 10 pounds.  Has been taken off ibuprofen due to kidney issues.  His knee really only hurts him significantly with weather changes.  He has given up walking for exercise.  Stretching helps.              ROS: All systems reviewed are negative as they relate to the chief complaint within the history of present illness.  Patient denies  fevers or chills.   Assessment & Plan: Visit Diagnoses:  1. Unilateral primary osteoarthritis, right knee     Plan: Impression is right knee medial compartment arthritis with no effusion today excellent range of motion and overall stable clinical picture.  Cautioned him against trying to do too much.  No indication for surgical intervention at this time.  Follow-up in January for clinical recheck.  He needs to be careful that he does not do too much and stay within his current stable envelope of function.  Follow-Up Instructions: No follow-ups on file.   Orders:  No orders of the defined types were placed in this encounter.  No orders of the defined types were placed in this encounter.     Procedures: No procedures performed   Clinical Data: No additional findings.  Objective: Vital Signs: Ht 5\' 11"  (1.803 m)   Wt 195 lb (88.5 kg)   BMI 27.20 kg/m   Physical Exam:   Constitutional:  Patient appears well-developed HEENT:  Head: Normocephalic Eyes:EOM are normal Neck: Normal range of motion Cardiovascular: Normal rate Pulmonary/chest: Effort normal Neurologic: Patient is alert Skin: Skin is warm Psychiatric: Patient has normal mood and affect    Ortho Exam: Ortho exam demonstrates full active and passive range of motion of the ankles and hips.  Right knee has range of motion 0-1 25.  No effusion.  Collateral and cruciate ligaments are stable.  Extensor mechanism is intact.  Pedal pulses palpable.  Specialty Comments:  No specialty comments available.  Imaging: No results found.   PMFS History: Patient Active Problem List   Diagnosis Date Noted  . Mallet fracture, closed, initial encounter 12/12/2019  . History of nephrolithiasis 07/14/2018  . Unilateral primary osteoarthritis, right knee 07/14/2018  . Idiopathic chronic gout without tophus 07/14/2018  . Kidney stones 08/19/2013  . Hyperlipidemia 06/28/2013   Past Medical History:  Diagnosis Date  . History of kidney stones   . HLD (hyperlipidemia)   . 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 OA (osteoarthritis)     Family History  Problem Relation Age of Onset  . Heart attack Father 21  . Other Father        open heart surgery  . Diabetes Father   .  Heart disease Other        uncles on both sides of famiily  . Hypertension Mother   . Diabetes Brother   . Hypertension Maternal Aunt   . Cancer Neg Hx     Past Surgical History:  Procedure Laterality Date  . KNEE SURGERY     multiple  . LEFT HEART CATH  2001  . ORTHOPEDIC SURGERY     several   Social History   Occupational History  . Not on file  Tobacco Use  . Smoking status: Never Smoker  . Smokeless tobacco: Never Used  Substance and Sexual Activity  . Alcohol use: No  . Drug use: No  . Sexual activity: Not on file

## 2020-11-29 NOTE — Telephone Encounter (Signed)
Labs are notable for the following:  Vitamin D looks good at 37.  Continue with current dosage.  Kidney function/creatinine has worsened slightly at 1.74.  This could potentially be caused by allopurinol for gout.  Uric acid level looks good at 6.0, but with worsening kidney function I think we should stop allopurinol and switch to Uloric, which can still lower the uric acid level but does not have the same adverse effect on kidneys.  We should then recheck kidney function and uric acid level in 6 to 8 weeks.  Thyroid function/TSH is slightly abnormal at 4.55.  This can cause fatigue and other symptoms.  We should recheck this in 6 to 8 weeks and if still elevated, could contemplate treating the thyroid with medication.  All else looks good.  We can mail him a copy of the labs.

## 2020-11-29 NOTE — Telephone Encounter (Signed)
Printed out the message and labs to give to patient here in the office (he saw Dr. August Saucer today). A hard copy of the Uloric Rx was given to him per request, to take to [facility] next week. He said he would just have them fill it there (instead of having to get it authorized by his insurance company).

## 2020-11-29 NOTE — Telephone Encounter (Signed)
I called and left a voice message to disregard the Rx for Uloric sent to them today. The patient will be getting it at the [facility] instead - he picked up the hard copy today.

## 2020-11-30 ENCOUNTER — Other Ambulatory Visit: Payer: Self-pay | Admitting: Family Medicine

## 2020-12-11 ENCOUNTER — Telehealth: Payer: Self-pay

## 2020-12-11 NOTE — Telephone Encounter (Signed)
I was trying to call the patient to let him know the status of the generic uloric Rx prior authorization -- reached his voice mail, which was full.   Have been trying to get this medication authorized since it was prescribed. Tried through [employer] first, which did not allow electronic approval. Printed out a request for coverage of non-formulary drug form, after chatting with 2 different CoverMyMeds reps. This was faxed to [employer] (for [employer]) on 12/06/20, with request to expedite the request. Still have heard nothing back. I called them ([facility]) today and spoke with Selena Batten --- on their end, it showed the authorization was requested for the first time today and is under review. I called [facility] and left a message on their voice mail advising them of this. We will let them know more once we receive something from the insurance company.

## 2020-12-12 NOTE — Telephone Encounter (Signed)
Finally received a fax this morning from [facility] - febuxostat was approved from 07/07/20 - 07/06/21. I called and left this info on the voice mail of [facility] and called the patient and advised him, as well.

## 2020-12-17 ENCOUNTER — Other Ambulatory Visit: Payer: Self-pay

## 2020-12-17 ENCOUNTER — Ambulatory Visit (INDEPENDENT_AMBULATORY_CARE_PROVIDER_SITE_OTHER): Payer: Medicare Other | Admitting: Family Medicine

## 2020-12-17 DIAGNOSIS — M25571 Pain in right ankle and joints of right foot: Secondary | ICD-10-CM | POA: Diagnosis not present

## 2020-12-17 NOTE — Progress Notes (Addendum)
Office Visit Note   Patient: Phillip Jackson           Date of Birth: [birth year]           MRN: [id number] Visit Date: 12/17/2020 Requested by: Lavada Mesi, MD [address] [place],  [place] [address] PCP: Lavada Mesi, MD  Subjective: Chief Complaint  Patient presents with   Right Ankle - Pain    Has had 3 gout flareups in the right ankle/foot in the last 2 months. All 3 started after he had been working outside for several days in high temperature/humidity conditions.   Right Foot - Pain    HPI: He is here with right great toe pain.  Gout flareup a couple days ago.  He is on prednisone now and is making progress since yesterday.  He was in quite a bit of pain when he called, but that seems to be improving.  Still waiting to get on Uloric.  He needs Uloric rather than allopurinol because of his chronic kidney disease and the fact that allopurinol can exacerbate that.  It is important that we get his uric acid level in normal range to prevent further damage to the joints.              ROS:   All other systems were reviewed and are negative.  Objective: Vital Signs: There were no vitals taken for this visit.  Physical Exam:  General:  Alert and oriented, in no acute distress. Pulm:  Breathing unlabored. Psy:  Normal mood, congruent affect. Skin: No erythema.  Slight increased warmth over the great toe MTP joint of the right foot.  Good range of motion with pain at the extremes.   Imaging: No results found.  Assessment & Plan: Improving gout attack right great toe - Elected not to inject since he is making progress with prednisone.  Return if symptoms worsen.     Procedures: No procedures performed        PMFS History: Patient Active Problem List   Diagnosis Date Noted   Mallet fracture, closed, initial encounter 12/12/2019   Stage 3a chronic kidney disease (HCC) 08/31/2019   History of nephrolithiasis 07/14/2018   Unilateral primary osteoarthritis, right  knee 07/14/2018   Idiopathic chronic gout without tophus 07/14/2018   Kidney stones 08/19/2013   Hyperlipidemia 06/28/2013   Past Medical History:  Diagnosis Date   History of kidney stones    HLD (hyperlipidemia)    Hx-TIA (transient ischemic attack) 2011   right brain with left arm weakness   OA (osteoarthritis)     Family History  Problem Relation Age of Onset   Heart attack Father 58   Other Father        open heart surgery   Diabetes Father    Heart disease Other        uncles on both sides of famiily   Hypertension Mother    Diabetes Brother    Hypertension Maternal Aunt    Cancer Neg Hx     Past Surgical History:  Procedure Laterality Date   KNEE SURGERY     multiple   LEFT HEART CATH  2001   ORTHOPEDIC SURGERY     several   Social History   Occupational History   Not on file  Tobacco Use   Smoking status: Never   Smokeless tobacco: Never  Substance and Sexual Activity   Alcohol use: No   Drug use: No   Sexual activity: Not on file

## 2021-01-21 ENCOUNTER — Ambulatory Visit (INDEPENDENT_AMBULATORY_CARE_PROVIDER_SITE_OTHER): Payer: Medicare Other | Admitting: Family Medicine

## 2021-01-21 ENCOUNTER — Other Ambulatory Visit: Payer: Self-pay

## 2021-01-21 DIAGNOSIS — R7989 Other specified abnormal findings of blood chemistry: Secondary | ICD-10-CM

## 2021-01-21 DIAGNOSIS — M1A00X0 Idiopathic chronic gout, unspecified site, without tophus (tophi): Secondary | ICD-10-CM

## 2021-01-21 LAB — BASIC METABOLIC PANEL
BUN/Creatinine Ratio: 15 (calc) (ref 6–22)
BUN: 23 mg/dL (ref 7–25)
CO2: 28 mmol/L (ref 20–32)
Calcium: 9.9 mg/dL (ref 8.6–10.3)
Chloride: 107 mmol/L (ref 98–110)
Creat: 1.56 mg/dL — ABNORMAL HIGH (ref 0.70–1.28)
Glucose, Bld: 122 mg/dL — ABNORMAL HIGH (ref 65–99)
Potassium: 4.2 mmol/L (ref 3.5–5.3)
Sodium: 142 mmol/L (ref 135–146)

## 2021-01-21 LAB — URIC ACID: Uric Acid, Serum: 4.4 mg/dL (ref 4.0–8.0)

## 2021-01-21 NOTE — Progress Notes (Signed)
   Office Visit Note   Patient: Phillip Jackson           Date of Birth: Sep 14, 1950           MRN: [id number] Visit Date: 01/21/2021 Requested by: Lavada Mesi, MD [address] [place],  [place] [address] PCP: Lavada Mesi, MD  Subjective: Chief Complaint  Patient presents with   OTHER    Follow up on gout -- has been taking the uloric bid, instead of once daily. Has had only a slight twinge of gout in the left foot -- was fine the next day.    HPI: Here for monitoring of gout.  Doing well on uloric.  Took it twice daily by mistake, now on once daily. Feeling great.                ROS:   All other systems were reviewed and are negative.  Objective: Vital Signs: There were no vitals taken for this visit.  Physical Exam:  General:  Alert and oriented, in no acute distress. Pulm:  Breathing unlabored. Psy:  Normal mood, congruent affect.  No exam done.    Imaging: No results found.  Assessment & Plan:  Gout, doing well. - Labs to monitor.  If uric acid ok, then d/c colchicine.     Procedures: No procedures performed        PMFS History: Patient Active Problem List   Diagnosis Date Noted   Mallet fracture, closed, initial encounter 12/12/2019   Stage 3a chronic kidney disease (HCC) 08/31/2019   History of nephrolithiasis 07/14/2018   Unilateral primary osteoarthritis, right knee 07/14/2018   Idiopathic chronic gout without tophus 07/14/2018   Kidney stones 08/19/2013   Hyperlipidemia 06/28/2013   Past Medical History:  Diagnosis Date   History of kidney stones    HLD (hyperlipidemia)    Hx-TIA (transient ischemic attack) 2011   right brain with left arm weakness   OA (osteoarthritis)     Family History  Problem Relation Age of Onset   Heart attack Father 81   Other Father        open heart surgery   Diabetes Father    Heart disease Other        uncles on both sides of famiily   Hypertension Mother    Diabetes Brother    Hypertension  Maternal Aunt    Cancer Neg Hx     Past Surgical History:  Procedure Laterality Date   KNEE SURGERY     multiple   LEFT HEART CATH  2001   ORTHOPEDIC SURGERY     several   Social History   Occupational History   Not on file  Tobacco Use   Smoking status: Never   Smokeless tobacco: Never  Substance and Sexual Activity   Alcohol use: No   Drug use: No   Sexual activity: Not on file

## 2021-01-22 ENCOUNTER — Telehealth: Payer: Self-pay | Admitting: Family Medicine

## 2021-01-22 NOTE — Telephone Encounter (Signed)
Uric acid looks perfect at 4.4.  Continue with current dosage of Uloric, and it is okay to discontinue colchicine.  Colchicine can be used in the future if needed for flareups.  Kidney function/creatinine has improved since last time at 1.56.  It is still not normal, but heading in the right direction.  This should be rechecked in 3 to 6 months.

## 2021-01-23 NOTE — Telephone Encounter (Signed)
I called and notified the patient of the results. Also, placed these results and the lab report in the mail to the patient's home address, per request.

## 2021-02-17 DIAGNOSIS — U071 COVID-19: Secondary | ICD-10-CM | POA: Diagnosis not present

## 2021-05-28 ENCOUNTER — Telehealth: Payer: Self-pay | Admitting: Family Medicine

## 2021-05-28 NOTE — Telephone Encounter (Signed)
Medical records emailed to [facility]. 

## 2021-06-18 DIAGNOSIS — U071 COVID-19: Secondary | ICD-10-CM | POA: Diagnosis not present

## 2021-07-25 DIAGNOSIS — Z789 Other specified health status: Secondary | ICD-10-CM | POA: Diagnosis not present

## 2021-07-25 DIAGNOSIS — D692 Other nonthrombocytopenic purpura: Secondary | ICD-10-CM | POA: Diagnosis not present

## 2021-07-25 DIAGNOSIS — B078 Other viral warts: Secondary | ICD-10-CM | POA: Diagnosis not present

## 2021-07-25 DIAGNOSIS — L218 Other seborrheic dermatitis: Secondary | ICD-10-CM | POA: Diagnosis not present

## 2021-07-25 DIAGNOSIS — L281 Prurigo nodularis: Secondary | ICD-10-CM | POA: Diagnosis not present

## 2021-09-25 DIAGNOSIS — H832X3 Labyrinthine dysfunction, bilateral: Secondary | ICD-10-CM | POA: Diagnosis not present

## 2021-09-25 DIAGNOSIS — H903 Sensorineural hearing loss, bilateral: Secondary | ICD-10-CM | POA: Diagnosis not present

## 2021-09-25 DIAGNOSIS — R42 Dizziness and giddiness: Secondary | ICD-10-CM | POA: Diagnosis not present

## 2021-09-25 DIAGNOSIS — H838X3 Other specified diseases of inner ear, bilateral: Secondary | ICD-10-CM | POA: Diagnosis not present

## 2021-10-08 DIAGNOSIS — R42 Dizziness and giddiness: Secondary | ICD-10-CM | POA: Diagnosis not present

## 2021-10-14 ENCOUNTER — Other Ambulatory Visit: Payer: Self-pay | Admitting: Family Medicine

## 2021-10-14 ENCOUNTER — Other Ambulatory Visit (HOSPITAL_COMMUNITY): Payer: Self-pay | Admitting: Family Medicine

## 2021-10-14 DIAGNOSIS — R519 Headache, unspecified: Secondary | ICD-10-CM

## 2021-10-14 DIAGNOSIS — M25511 Pain in right shoulder: Secondary | ICD-10-CM

## 2021-10-14 DIAGNOSIS — M542 Cervicalgia: Secondary | ICD-10-CM

## 2021-10-15 DIAGNOSIS — R42 Dizziness and giddiness: Secondary | ICD-10-CM | POA: Diagnosis not present

## 2021-10-15 DIAGNOSIS — H832X3 Labyrinthine dysfunction, bilateral: Secondary | ICD-10-CM | POA: Diagnosis not present

## 2021-11-07 DIAGNOSIS — L298 Other pruritus: Secondary | ICD-10-CM | POA: Diagnosis not present

## 2021-11-07 DIAGNOSIS — L570 Actinic keratosis: Secondary | ICD-10-CM | POA: Diagnosis not present

## 2021-11-07 DIAGNOSIS — X32XXXA Exposure to sunlight, initial encounter: Secondary | ICD-10-CM | POA: Diagnosis not present

## 2021-11-12 ENCOUNTER — Encounter (HOSPITAL_COMMUNITY): Payer: Self-pay

## 2021-11-12 ENCOUNTER — Ambulatory Visit (HOSPITAL_COMMUNITY): Payer: Medicare Other

## 2021-11-26 DIAGNOSIS — H903 Sensorineural hearing loss, bilateral: Secondary | ICD-10-CM | POA: Diagnosis not present

## 2021-11-26 DIAGNOSIS — R42 Dizziness and giddiness: Secondary | ICD-10-CM | POA: Diagnosis not present

## 2021-11-26 DIAGNOSIS — H6982 Other specified disorders of Eustachian tube, left ear: Secondary | ICD-10-CM | POA: Diagnosis not present

## 2021-11-29 ENCOUNTER — Ambulatory Visit (HOSPITAL_COMMUNITY): Payer: Medicare Other

## 2021-12-04 ENCOUNTER — Encounter (HOSPITAL_COMMUNITY): Payer: Self-pay | Admitting: Certified Registered"

## 2021-12-04 NOTE — Progress Notes (Signed)
Multiple attempts made to contact pt with instructions for his MRI with anesthesia. No answer, and mailbox full.

## 2021-12-05 ENCOUNTER — Ambulatory Visit (HOSPITAL_COMMUNITY): Payer: Medicare Other

## 2021-12-05 ENCOUNTER — Encounter (HOSPITAL_COMMUNITY): Payer: Self-pay

## 2021-12-05 ENCOUNTER — Ambulatory Visit: Admit: 2021-12-05 | Payer: Medicare Other

## 2021-12-05 SURGERY — MRI WITH ANESTHESIA
Anesthesia: General

## 2021-12-12 ENCOUNTER — Ambulatory Visit (INDEPENDENT_AMBULATORY_CARE_PROVIDER_SITE_OTHER): Payer: Medicare Other | Admitting: Specialist

## 2021-12-12 ENCOUNTER — Telehealth: Payer: Self-pay | Admitting: Orthopedic Surgery

## 2021-12-12 ENCOUNTER — Encounter: Payer: Self-pay | Admitting: Specialist

## 2021-12-12 VITALS — BP 156/90 | HR 62 | Ht 70.0 in | Wt 215.0 lb

## 2021-12-12 DIAGNOSIS — M4815 Ankylosing hyperostosis [Forestier], thoracolumbar region: Secondary | ICD-10-CM | POA: Diagnosis not present

## 2021-12-12 DIAGNOSIS — M533 Sacrococcygeal disorders, not elsewhere classified: Secondary | ICD-10-CM

## 2021-12-12 DIAGNOSIS — M5136 Other intervertebral disc degeneration, lumbar region: Secondary | ICD-10-CM | POA: Diagnosis not present

## 2021-12-12 DIAGNOSIS — M47816 Spondylosis without myelopathy or radiculopathy, lumbar region: Secondary | ICD-10-CM | POA: Diagnosis not present

## 2021-12-12 DIAGNOSIS — M1711 Unilateral primary osteoarthritis, right knee: Secondary | ICD-10-CM

## 2021-12-12 MED ORDER — DULOXETINE HCL 20 MG PO CPEP: 20.0000 mg | ORAL_CAPSULE | Freq: Every day | ORAL | 3 refills | Status: AC

## 2021-12-12 NOTE — Progress Notes (Signed)
Follow-Up Instructions: No follow-ups on file.    Office Visit Note   Patient: Phillip Jackson           Date of Birth: 05-14-1951           MRN: [id number] Visit Date: 12/12/2021              Requested by: Eunice Blase, MD [place],  [place] [address] PCP: Eunice Blase, MD   Assessment & Plan: Visit Diagnoses:  1. Spondylosis without myelopathy or radiculopathy, lumbar region   2. Forestier's disease of thoracolumbar region   3. Sacroiliac joint dysfunction of both sides   4. Unilateral primary osteoarthritis, right knee   5. Degenerative disc disease, lumbar     Plan: Knee is suffering from osteoarthritis, only real proven treatments are Weight loss, tylenol and exercise. Well padded shoes help. Ice the knee that is suffering from osteoarthritis, only real proven treatments are  Well padded shoes help. Ice the knee 2-3 times a day 15-20 mins at a time.-3 times a day 15-20 mins at a time. Hot showers in the AM.  Injection with steroid may be of benefit. Hemp CBD capsules, amazon.com 5,000-7,000 mg per bottle, 60 capsules per bottle, take one capsule twice a day. Cane in the right hand to use with right leg weight bearing. Cymbalta for arthritis pain may be beneficial in light of inability to use arthritis medications. DO NOT TAKE NSAIDs ( motrin, asa, naprosyn or naproxen or other arthritis medications) to the affect of kidney injury that is due to their use.  Tumeric or celery, berry juices are helpful.  Follow-Up Instructions: No follow-ups on file.   Orders:  No orders of the defined types were placed in this encounter.  No orders of the defined types were placed in this encounter.     Procedures: No procedures performed   Clinical Data: No additional findings.   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 He has been  treated for right knee pain and this has been giving him trouble since then. He reports that at the time he injured his  Back when showering and slipping and falling doubling over a half wall nearby and reportedly doubling over the wall falling backwards and the concrete wall hit the back. He reports that at the end of the day he has to sit down due to pain in the lumbar spine transverse mid lumbar spine. Reports that he has an area of redness or across the lumbar spine after doing some yard work, working all day in the yard. After sitting for 20 min post exercise he notices the pain and has difficulty getting up from sitting. Bothers him with stooped position, like with mowing with a riding lawnmower there is pain post prolong riding in the mower or with riding in a car. Cough or sneeze is painful, and when painful it is difficult to sleep. Presently it is painful with prolong sitting. No bowel or bladder difficulty. He is unable to walk a mile due to right knee pain and back pain. Reports that he is not sure if the back cause the knee to hurt or if the knee causes the back to hurt.    Review of Systems  Constitutional: Negative.   HENT: Negative.    Eyes: Negative.   Respiratory: Negative.    Cardiovascular: Negative.  Gastrointestinal: Negative.   Endocrine: Negative.   Genitourinary: Negative.   Musculoskeletal: Negative.   Skin: Negative.   Allergic/Immunologic: Negative.   Neurological: Negative.   Hematological: Negative.   Psychiatric/Behavioral: Negative.       Objective: Vital Signs: BP (!) 156/90 (BP Location: Left Arm, Patient Position: Sitting, Cuff Size: Large)   Pulse 62   Ht 5\' 10"  (1.778 m)   Wt 215 lb (97.5 kg)   BMI 30.85 kg/m   Physical Exam Constitutional:      Appearance: He is well-developed.  HENT:     Head: Normocephalic and atraumatic.  Eyes:     Pupils: Pupils are equal, round, and reactive to light.  Pulmonary:     Effort: Pulmonary effort is  normal.     Breath sounds: Normal breath sounds.  Abdominal:     General: Bowel sounds are normal.     Palpations: Abdomen is soft.  Musculoskeletal:        General: Normal range of motion.     Cervical back: Normal range of motion and neck supple.  Skin:    General: Skin is warm and dry.  Neurological:     Mental Status: He is alert and oriented to person, place, and time.  Psychiatric:        Behavior: Behavior normal.        Thought Content: Thought content normal.        Judgment: Judgment normal.     Back Exam   Tenderness  The patient is experiencing tenderness in the lumbar.      Specialty Comments:  No specialty comments available.  Imaging: No results found.   PMFS History: Patient Active Problem List   Diagnosis Date Noted   Mallet fracture, closed, initial encounter 12/12/2019   Stage 3a chronic kidney disease ([place]) 08/31/2019   History of nephrolithiasis 07/14/2018   Unilateral primary osteoarthritis, right knee 07/14/2018   Idiopathic chronic gout without tophus 07/14/2018   Kidney stones 08/19/2013   Hyperlipidemia 06/28/2013   Past Medical History:  Diagnosis Date   History of kidney stones    HLD (hyperlipidemia)    Hx-TIA (transient ischemic attack) 2011   right brain with left arm weakness   OA (osteoarthritis)     Family History  Problem Relation Age of Onset   Heart attack Father 88   Other Father        open heart surgery   Diabetes Father    Heart disease Other        uncles on both sides of famiily   Hypertension Mother    Diabetes Brother    Hypertension Maternal Aunt    Cancer Neg Hx     Past Surgical History:  Procedure Laterality Date   KNEE SURGERY     multiple   LEFT HEART CATH  2001   ORTHOPEDIC SURGERY     several   Social History   Occupational History   Not on file  Tobacco Use   Smoking status: Never   Smokeless tobacco: Never  Substance and Sexual Activity   Alcohol use: No   Drug use: No   Sexual  activity: Not on file

## 2021-12-12 NOTE — Patient Instructions (Signed)
Plan: Knee is suffering from osteoarthritis, only real proven treatments are Weight loss, tylenol and exercise. Well padded shoes help. Ice the knee that is suffering from osteoarthritis, only real proven treatments are  Well padded shoes help. Ice the knee 2-3 times a day 15-20 mins at a time.-3 times a day 15-20 mins at a time. Hot showers in the AM.  Injection with steroid may be of benefit. Hemp CBD capsules, amazon.com 5,000-7,000 mg per bottle, 60 capsules per bottle, take one capsule twice a day. Cane in the right hand to use with right leg weight bearing. Cymbalta for arthritis pain may be beneficial in light of inability to use arthritis medications. DO NOT TAKE NSAIDs ( motrin, asa, naprosyn or naproxen or other arthritis medications) to the affect of kidney injury that is due to their use.  Tumeric or celery, berry juices are helpful.

## 2021-12-12 NOTE — Telephone Encounter (Signed)
Patient was here to see Nitka. Says he needs to see Dr. August Saucer before 6/20. Would like to be worked in. His call back number is [phone]

## 2021-12-13 NOTE — Telephone Encounter (Signed)
IC advised no sooner appt Scheduled patient to see Dr August Saucer next available.

## 2021-12-16 DIAGNOSIS — N1831 Chronic kidney disease, stage 3a: Secondary | ICD-10-CM | POA: Diagnosis not present

## 2021-12-18 DIAGNOSIS — N200 Calculus of kidney: Secondary | ICD-10-CM | POA: Diagnosis not present

## 2021-12-18 DIAGNOSIS — N189 Chronic kidney disease, unspecified: Secondary | ICD-10-CM | POA: Diagnosis not present

## 2021-12-18 DIAGNOSIS — N281 Cyst of kidney, acquired: Secondary | ICD-10-CM | POA: Diagnosis not present

## 2022-01-08 ENCOUNTER — Ambulatory Visit: Payer: Medicare Other | Admitting: Orthopedic Surgery

## 2022-01-21 IMAGING — CT CT ABD-PELV W/O CM
1 of 2 series · 14 of 32 positions shown, 19 images · non-contrast
Comparison: None.

CLINICAL DATA: Left side abdominal pain.  Evaluate for hernia

EXAM:
CT ABDOMEN AND PELVIS WITHOUT CONTRAST
TECHNIQUE: Multidetector CT imaging of the abdomen and pelvis was performed
following the standard protocol without IV contrast.

[Series 2: abd/pelvis w/(date) · axial · 0.84mm/px · z∈[-524,-89]mm · 14 of 99 slices shown, 19 images]
[im 6/99  soft-tissue]
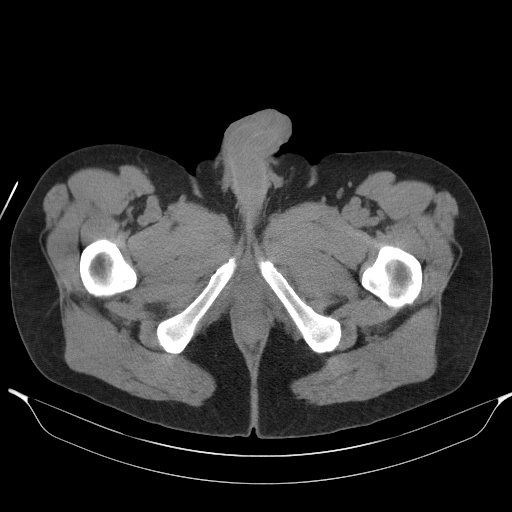
[im 6/99  bone]
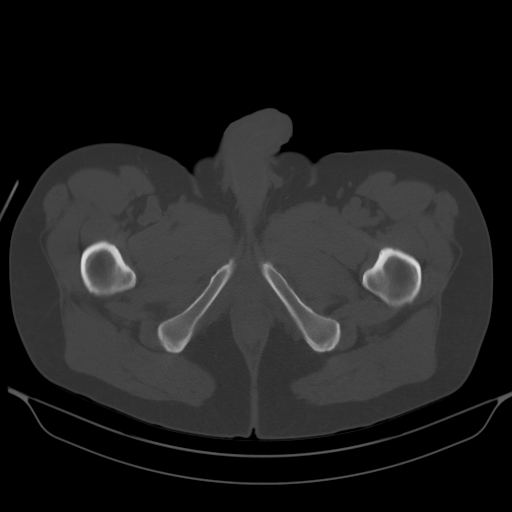
[im 11/99  soft-tissue]
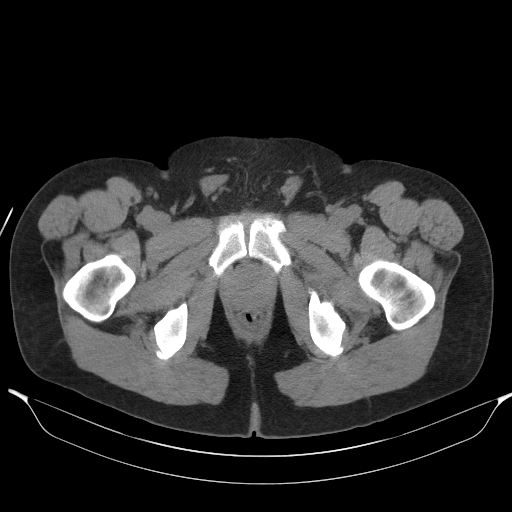
[im 22/99  soft-tissue]
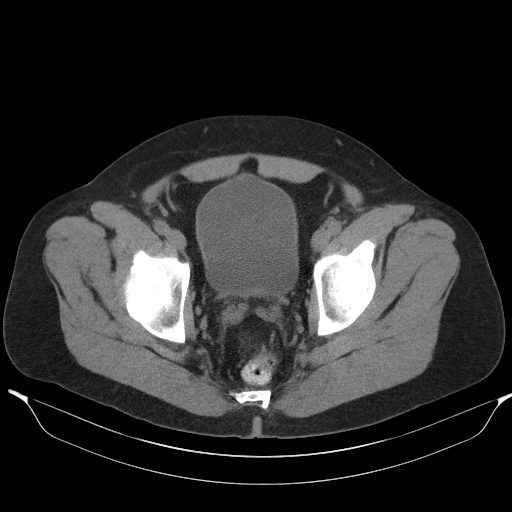
[im 28/99  soft-tissue]
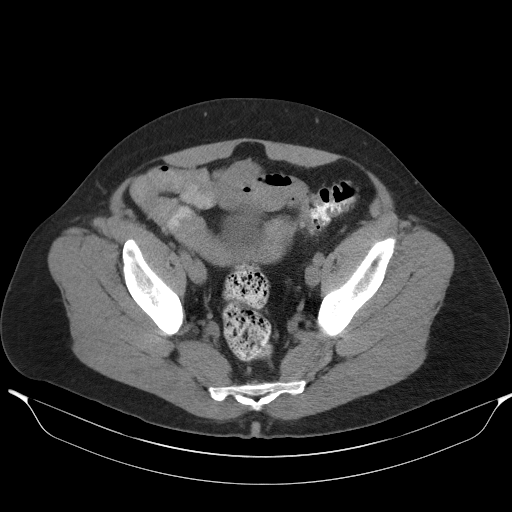
[im 33/99  soft-tissue]
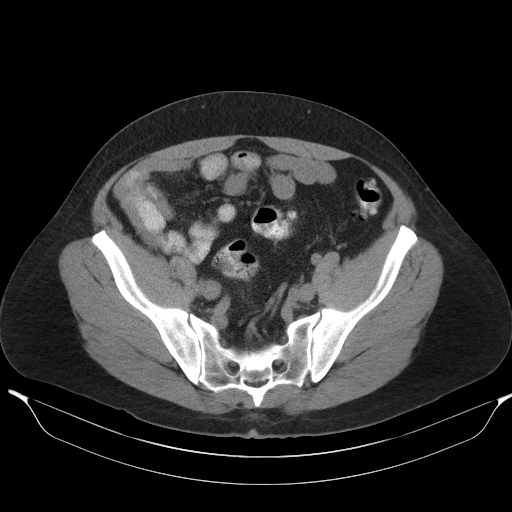
[im 44/99  soft-tissue]
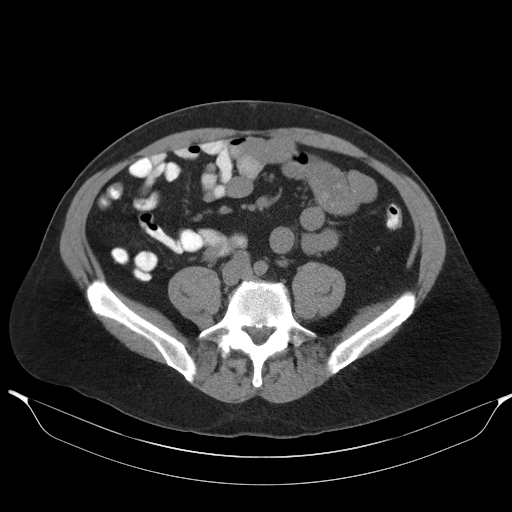
[im 50/99  soft-tissue]
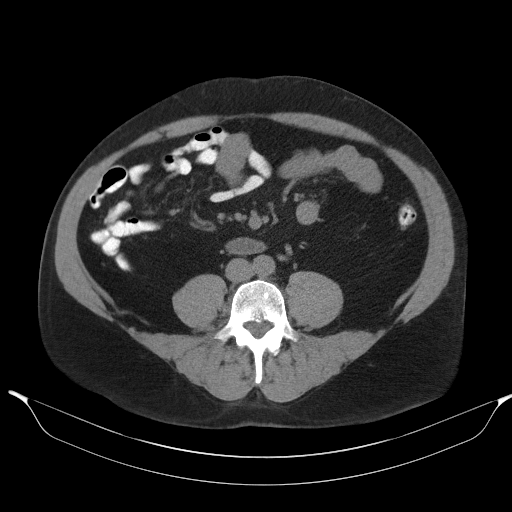
[im 55/99  soft-tissue]
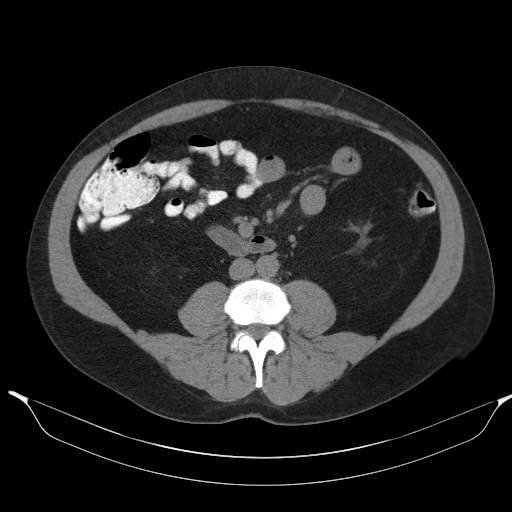
[im 66/99  soft-tissue]
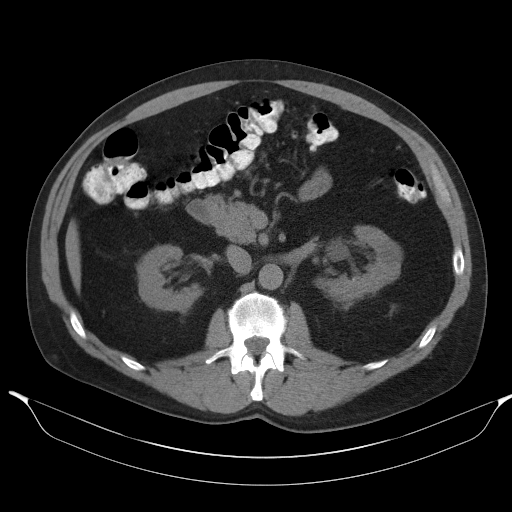
[im 66/99  bone]
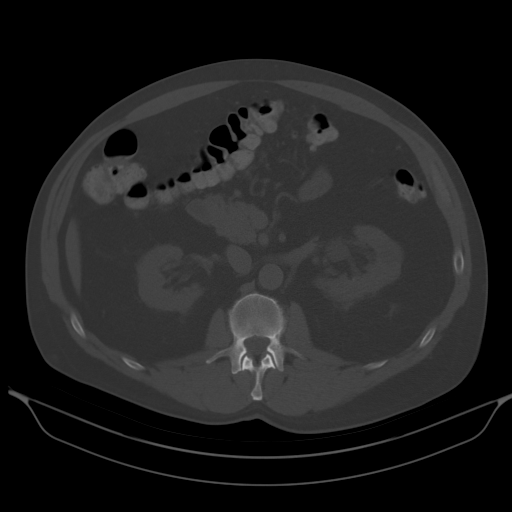
[im 71/99  soft-tissue]
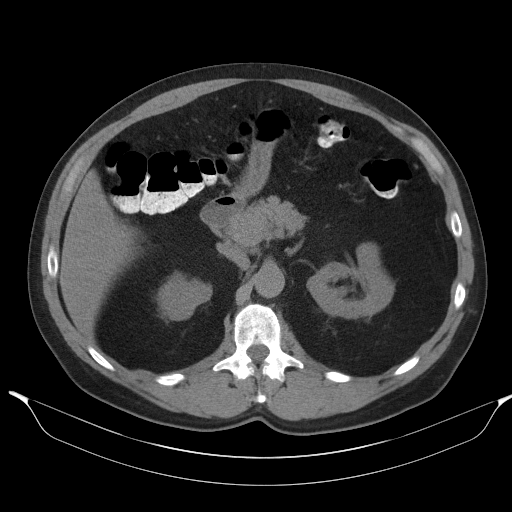
[im 77/99  soft-tissue]
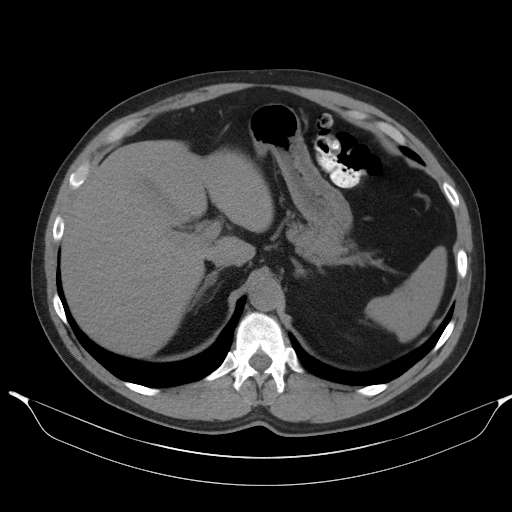
[im 77/99  lung]
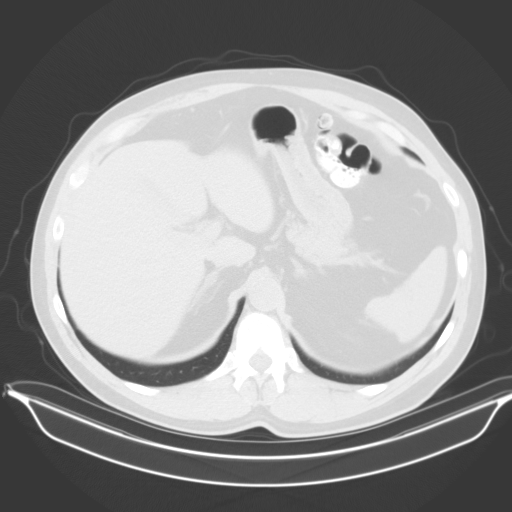
[im 82/99  lung]
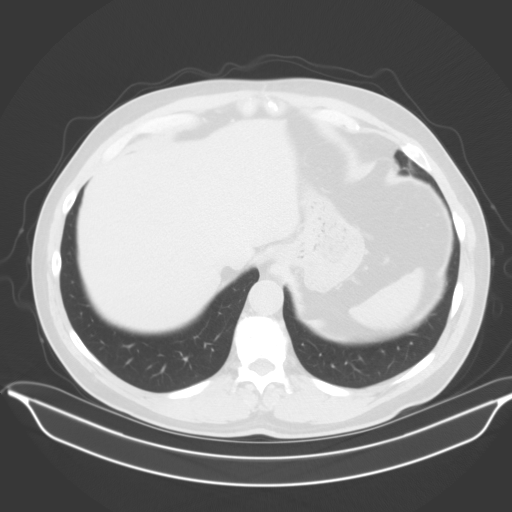
[im 88/99  soft-tissue]
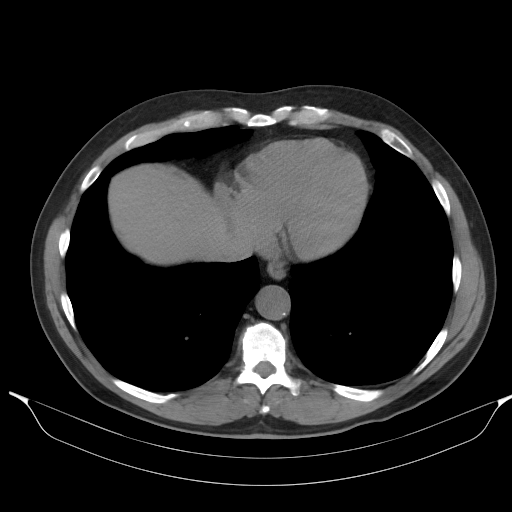
[im 88/99  lung]
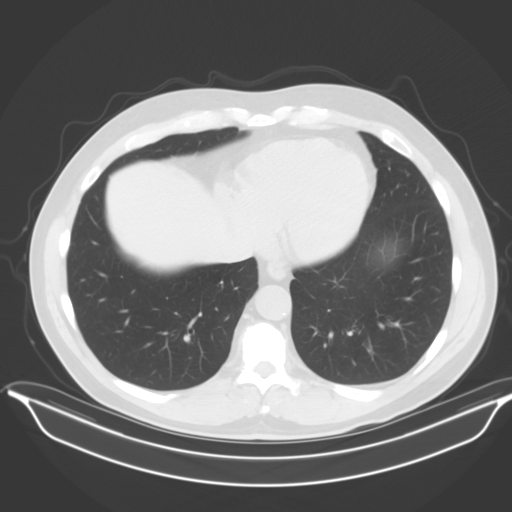
[im 93/99  soft-tissue]
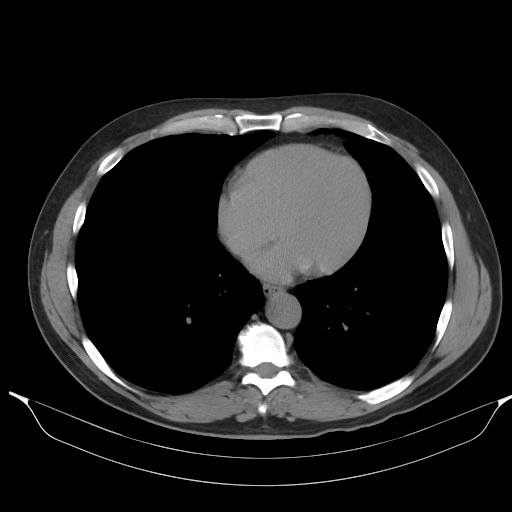
[im 93/99  lung]
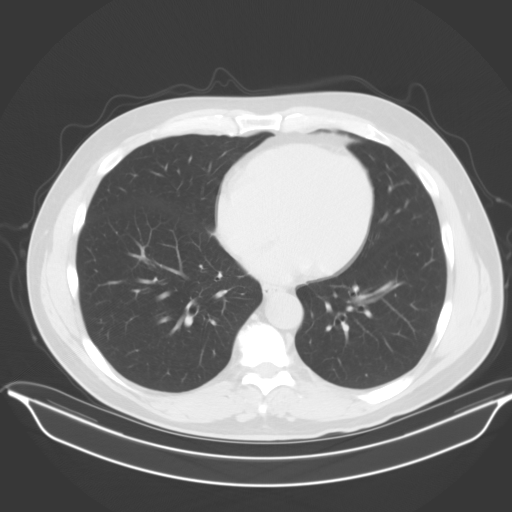

[14 of 32 positions shown; findings below may reference images not displayed]

FINDINGS: Lower chest: Lung bases are clear. No effusions. Heart is normal
size.

Hepatobiliary: No focal hepatic abnormality. Gallbladder
unremarkable.

Pancreas: No focal abnormality or ductal dilatation.

Spleen: No focal abnormality.  Normal size.

Adrenals/Urinary Tract: Small nonobstructing bilateral renal stones.
No ureteral stones or hydronephrosis. Urinary bladder and adrenal
glands unremarkable. 2.9 cm low-density lesion in the midpole of the
left kidney, likely cyst.

Stomach/Bowel: Left colonic diverticulosis. No active
diverticulitis. Stomach and small bowel decompressed. No bowel
obstruction.

Vascular/Lymphatic: Aortic atherosclerosis. No enlarged abdominal or
pelvic lymph nodes.

Reproductive: Prostate enlargement

Other: No free fluid or free air. No inguinal or ventral wall
hernia.

Musculoskeletal: No acute bony abnormality.
IMPRESSION: Bilateral nephrolithiasis.  No hydronephrosis.

Left colonic diverticulosis.  No active diverticulitis.

Scattered aortic atherosclerosis.

2.9 cm low-density lesion within the left kidney, likely cyst. This
could be further characterized with renal ultrasound if felt
clinically indicated.

## 2022-02-18 DIAGNOSIS — H43811 Vitreous degeneration, right eye: Secondary | ICD-10-CM | POA: Diagnosis not present

## 2022-02-18 DIAGNOSIS — H2513 Age-related nuclear cataract, bilateral: Secondary | ICD-10-CM | POA: Diagnosis not present

## 2022-02-18 DIAGNOSIS — H40013 Open angle with borderline findings, low risk, bilateral: Secondary | ICD-10-CM | POA: Diagnosis not present

## 2022-02-18 DIAGNOSIS — H18413 Arcus senilis, bilateral: Secondary | ICD-10-CM | POA: Diagnosis not present

## 2022-02-18 DIAGNOSIS — H527 Unspecified disorder of refraction: Secondary | ICD-10-CM | POA: Diagnosis not present

## 2022-03-05 ENCOUNTER — Other Ambulatory Visit: Payer: Self-pay | Admitting: Family Medicine

## 2022-03-05 DIAGNOSIS — E785 Hyperlipidemia, unspecified: Secondary | ICD-10-CM

## 2022-03-14 ENCOUNTER — Ambulatory Visit: Payer: Medicare Other | Admitting: Specialist

## 2022-03-20 ENCOUNTER — Ambulatory Visit: Payer: Medicare Other | Admitting: Specialist

## 2022-03-24 DIAGNOSIS — N1831 Chronic kidney disease, stage 3a: Secondary | ICD-10-CM | POA: Diagnosis not present

## 2022-05-07 ENCOUNTER — Telehealth: Payer: Self-pay

## 2022-05-07 NOTE — Telephone Encounter (Signed)
Spoke with pt per Dr. Gwenlyn Found and Dr. Junius Roads to schedule for office visit. Pt scheduled to see Dr. Gwenlyn Found on 11/3. Instructions provided for getting to our office. Pt verbalizes understanding.

## 2022-05-09 ENCOUNTER — Encounter: Payer: Self-pay | Admitting: Cardiovascular Disease

## 2022-05-09 ENCOUNTER — Ambulatory Visit: Payer: Medicare Other | Attending: Cardiovascular Disease | Admitting: Cardiovascular Disease

## 2022-05-09 ENCOUNTER — Ambulatory Visit (INDEPENDENT_AMBULATORY_CARE_PROVIDER_SITE_OTHER): Payer: Medicare Other

## 2022-05-09 VITALS — BP 120/78 | HR 59 | Ht 70.0 in | Wt 208.0 lb

## 2022-05-09 DIAGNOSIS — R002 Palpitations: Secondary | ICD-10-CM

## 2022-05-09 DIAGNOSIS — E782 Mixed hyperlipidemia: Secondary | ICD-10-CM | POA: Diagnosis not present

## 2022-05-09 DIAGNOSIS — Z8249 Family history of ischemic heart disease and other diseases of the circulatory system: Secondary | ICD-10-CM

## 2022-05-09 LAB — HEPATIC FUNCTION PANEL
ALT: 18 IU/L (ref 0–44)
AST: 22 IU/L (ref 0–40)
Albumin: 4.3 g/dL (ref 3.8–4.8)
Alkaline Phosphatase: 56 IU/L (ref 44–121)
Bilirubin Total: 0.5 mg/dL (ref 0.0–1.2)
Bilirubin, Direct: 0.14 mg/dL (ref 0.00–0.40)
Total Protein: 6.9 g/dL (ref 6.0–8.5)

## 2022-05-09 LAB — LIPID PANEL
Chol/HDL Ratio: 3.8 ratio (ref 0.0–5.0)
Cholesterol, Total: 226 mg/dL — ABNORMAL HIGH (ref 100–199)
HDL: 60 mg/dL (ref >39)
LDL Chol Calc (NIH): 155 mg/dL — ABNORMAL HIGH (ref 0–99)
Triglycerides: 63 mg/dL (ref 0–149)
VLDL Cholesterol Cal: 11 mg/dL (ref 5–40)

## 2022-05-09 NOTE — Progress Notes (Signed)
05/09/2022 [name]   [birth year]  [id number]  Primary Physician Hilts, Legrand Como, MD Primary Cardiologist: Lorretta Harp MD Renae Gloss  HPI:  Phillip Jackson is a 71 y.o. 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  He was referred by Dr. Junius Roads for cardiovascular valuation because of palpitations.  He is a previous patient of Dr. Jeralene Peters Smith's who he last saw 06/28/2013 for chest pain.  His cardiac risk factors are notable for hyperlipidemia not on statin therapy and family history of heart disease with father had a myocardial infarction and CABG at age 59 and died 75 years later.  He is never had a heart attack or stroke.  He denies chest pain or shortness of breath.  He did have a normal Myoview stress test performed 07/20/2013.  He does complain of nocturnal palpitations but really denies chest pain.  He did have an event monitor performed 08/26/2019 revealing 6 episodes of SVT longest of which was 20 beats at 190 bpm with a PVC burden of less than 1%.  He is very active during the day around the yard and doing projects and is completely asymptomatic during his activities.  He is scheduled to have a coronary calcium score performed in several weeks.  Current Meds  Medication Sig   Colchicine (MITIGARE) 0.6 MG CAPS Take 1 capsule by mouth 2 (two) times daily as needed.   DULoxetine (CYMBALTA) 20 MG capsule Take 1 capsule (20 mg total) by mouth daily.   Febuxostat 80 MG TABS Take 1 tablet by mouth daily.   FLOMAX 0.4 MG CAPS capsule Take by mouth daily.   NITROSTAT 0.4 MG SL tablet Place under the tongue.     Allergies  Allergen Reactions   Codeine     REACTION: hallucinations    Social History   Socioeconomic History   Marital status: Married    Spouse name: Not on file   Number of children: Not on file   Years of education: Not on file   Highest  education level: Not on file  Occupational History   Not on file  Tobacco Use   Smoking status: Never   Smokeless tobacco: Never  Substance and Sexual Activity   Alcohol use: No   Drug use: No   Sexual activity: Not on file  Other Topics Concern   Not on file  Social History Narrative   Not on file   Social Determinants of Health   Financial Resource Strain: Not on file  Food Insecurity: Not on file  Transportation Needs: Not on file  Physical Activity: Not on file  Stress: Not on file  Social Connections: Not on file  Intimate Partner Violence: Not on file     Review of Systems: General: negative for chills, fever, night sweats or weight changes.  Cardiovascular: negative for chest pain, dyspnea on exertion, edema, orthopnea, palpitations, paroxysmal nocturnal dyspnea or shortness of breath Dermatological: negative for rash Respiratory: negative for cough or wheezing Urologic: negative for hematuria Abdominal: negative for nausea, vomiting, diarrhea, bright red blood per rectum, melena, or hematemesis Neurologic: negative for visual changes, syncope, or dizziness All other systems reviewed and are otherwise negative except as noted above.    Blood pressure 120/78, pulse (!) 59, height 5\' 10"  (1.778 m), weight 208 lb (94.3 kg), SpO2 96 %.  General appearance: alert and no distress Neck: no adenopathy, no carotid bruit, no  JVD, supple, symmetrical, trachea midline, and thyroid not enlarged, symmetric, no tenderness/mass/nodules Lungs: clear to auscultation bilaterally Heart: regular rate and rhythm, S1, S2 normal, no murmur, click, rub or gallop Extremities: extremities normal, atraumatic, no cyanosis or edema Pulses: 2+ and symmetric Skin: Skin color, texture, turgor normal. No rashes or lesions Neurologic: Grossly normal  EKG sinus bradycardia at 59 with left anterior fascicular block, mild QRS widening with septal Q waves.  I personally reviewed this  EKG.  ASSESSMENT AND PLAN:   Hyperlipidemia History of hyperlipidemia not on statin therapy lipid profile performed 11/28/2020 revealing total cholesterol 232, LDL 146 and HDL of 63.  I am going to get a coronary calcium score on him given his family history.  I suspect he will need to initiate statin therapy.  Family history of heart disease Father had a myocardial infarction and CABG at age 60.  He died 10 years later.  Palpitations Patient was referred for palpitations that occur principally at night when he is lying down.  He is very active and is retired from [employer] manager, [employer] as a Surveyor, minerals.  He did have a monitor performed 08/26/2019 that showed 6 episodes of SVT longest being 20 beats and the fastest 190 bpm with a PVC burden of less than 1%.  I am going to repeat a 2-week Zio patch.     Runell Gess MD FACP,FACC,FAHA, [facility] 05/09/2022 8:57 AM

## 2022-05-09 NOTE — Patient Instructions (Addendum)
Medication Instructions:  Your physician recommends that you continue on your current medications as directed. Please refer to the Current Medication list given to you today.  *If you need a refill on your cardiac medications before your next appointment, please call your pharmacy*   Lab Work: Your physician recommends that you have labs drawn today: Lipid/liver panel  If you have labs (blood work) drawn today and your tests are completely normal, you will receive your results only by: MyChart Message (if you have MyChart) OR A paper copy in the mail If you have any lab test that is abnormal or we need to change your treatment, we will call you to review the results.   Testing/Procedures:  Bryn Gulling- Long Term Monitor Instructions  Your physician has requested you wear a ZIO patch monitor for 14 days.  This is a single patch monitor. [employer] supplies one patch monitor per enrollment. Additional stickers are not available. Please do not apply patch if you will be having a Nuclear Stress Test,  Echocardiogram, Cardiac CT, MRI, or Chest Xray during the period you would be wearing the  monitor. The patch cannot be worn during these tests. You cannot remove and re-apply the  ZIO XT patch monitor.  Your ZIO patch monitor will be mailed 3 day USPS to your address on file. It may take 3-5 days  to receive your monitor after you have been enrolled.  Once you have received your monitor, please review the enclosed instructions. Your monitor  has already been registered assigning a specific monitor serial # to you.  Billing and Patient Assistance Program Information  We have supplied [employer] with any of your insurance information on file for billing purposes. [employer] offers a sliding scale Patient Assistance Program for patients that do not have  insurance, or whose insurance does not completely cover the cost of the ZIO monitor.  You must apply for the Patient Assistance Program to qualify for  this discounted rate.  To apply, please call [employer] at [phone], select option 4, select option 2, ask to apply for  Patient Assistance Program. Theodore Demark will ask your household income, and how many people  are in your household. They will quote your out-of-pocket cost based on that information.  [employer] will also be able to set up a 6-month, interest-free payment plan if needed.  Applying the monitor   Shave hair from upper left chest.  Hold abrader disc by orange tab. Rub abrader in 40 strokes over the upper left chest as  indicated in your monitor instructions.  Clean area with 4 enclosed alcohol pads. Let dry.  Apply patch as indicated in monitor instructions. Patch will be placed under collarbone on left  side of chest with arrow pointing upward.  Rub patch adhesive wings for 2 minutes. Remove white label marked "1". Remove the white  label marked "2". Rub patch adhesive wings for 2 additional minutes.  While looking in a mirror, press and release button in center of patch. A small green light will  flash 3-4 times. This will be your only indicator that the monitor has been turned on.  Do not shower for the first 24 hours. You may shower after the first 24 hours.  Press the button if you feel a symptom. You will hear a small click. Record Date, Time and  Symptom in the Patient Logbook.  When you are ready to remove the patch, follow instructions on the last 2 pages of Patient  Logbook. Stick patch monitor  onto the last page of Patient Logbook.  Place Patient Logbook in the blue and white box. Use locking tab on box and tape box closed  securely. The blue and white box has prepaid postage on it. Please place it in the mailbox as  soon as possible. Your physician should have your test results approximately 7 days after the  monitor has been mailed back to [facility].  Call [place] at [phone] if you have questions regarding  your ZIO XT patch monitor.  Call them immediately if you see an orange light blinking on your  monitor.  If your monitor falls off in less than 4 days, contact our Monitor department at [phone].  If your monitor becomes loose or falls off after 4 days call [employer] at [phone] for  suggestions on securing your monitor    Follow-Up: At [facility], you and your health needs are our priority.  As part of our continuing mission to provide you with exceptional heart care, we have created designated Provider Care Teams.  These Care Teams include your primary Cardiologist (physician) and Advanced Practice Providers (APPs -  Physician Assistants and Nurse Practitioners) who all work together to provide you with the care you need, when you need it.  We recommend signing up for the patient portal called "MyChart".  Sign up information is provided on this After Visit Summary.  MyChart is used to connect with patients for Virtual Visits (Telemedicine).  Patients are able to view lab/test results, encounter notes, upcoming appointments, etc.  Non-urgent messages can be sent to your provider as well.   To learn more about what you can do with MyChart, go to [email or web address].    Your next appointment:   5-6 week(s)  The format for your next appointment:   In Person  Provider:   Quay Burow, MD

## 2022-05-09 NOTE — Progress Notes (Unsigned)
Enrolled patient for a 14 day Zio XT  monitor to be mailed to patients home  °

## 2022-05-09 NOTE — Assessment & Plan Note (Signed)
History of hyperlipidemia not on statin therapy lipid profile performed 11/28/2020 revealing total cholesterol 232, LDL 146 and HDL of 63.  I am going to get a coronary calcium score on him given his family history.  I suspect he will need to initiate statin therapy.

## 2022-05-09 NOTE — Assessment & Plan Note (Signed)
Father had a myocardial infarction and CABG at age 72.  He died 28 years later.

## 2022-05-09 NOTE — Assessment & Plan Note (Signed)
Patient was referred for palpitations that occur principally at night when he is lying down.  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  He did have a monitor performed 08/26/2019 that showed 6 episodes of SVT longest being 20 beats and the fastest 190 bpm with a PVC burden of less than 1%.  I am going to repeat a 2-week Zio patch.

## 2022-05-12 NOTE — Addendum Note (Signed)
Addended by: Gean Birchwood on: 05/12/2022 09:27 AM   Modules accepted: Orders

## 2022-05-19 DIAGNOSIS — R002 Palpitations: Secondary | ICD-10-CM | POA: Diagnosis not present

## 2022-05-20 ENCOUNTER — Telehealth: Payer: Self-pay | Admitting: Cardiovascular Disease

## 2022-05-20 MED ORDER — ROSUVASTATIN CALCIUM 20 MG PO TABS: 20.0000 mg | ORAL_TABLET | Freq: Every day | ORAL | 3 refills | Status: DC

## 2022-05-20 NOTE — Telephone Encounter (Signed)
Runell Gess, MD 05/10/2022 12:56 PM EDT     LDL 155. Start Crestor 20 mg /day and adjust based on CTA. LDL goal TBD   Returned call to pt, he states that he was calling Raynelle Fanning back to update pharmacy as requested. New rx sent to requested pharmacy.

## 2022-05-20 NOTE — Telephone Encounter (Signed)
Patient stated the pharmacy needs to be [id number] - [place], [place] - [address].

## 2022-05-26 ENCOUNTER — Other Ambulatory Visit: Payer: Medicare Other

## 2022-06-04 DIAGNOSIS — R002 Palpitations: Secondary | ICD-10-CM | POA: Diagnosis not present

## 2022-06-12 ENCOUNTER — Other Ambulatory Visit: Payer: Medicare Other

## 2022-06-20 ENCOUNTER — Ambulatory Visit: Payer: Medicare Other | Admitting: Cardiovascular Disease

## 2022-07-17 ENCOUNTER — Other Ambulatory Visit: Payer: Medicare Other

## 2022-07-18 ENCOUNTER — Ambulatory Visit: Payer: Medicare Other | Admitting: Cardiovascular Disease

## 2022-07-29 ENCOUNTER — Telehealth: Payer: Self-pay | Admitting: Cardiovascular Disease

## 2022-07-29 ENCOUNTER — Other Ambulatory Visit: Payer: Medicare Other

## 2022-07-29 NOTE — Telephone Encounter (Signed)
Pt was schedule to have CT done today 1/23. When arriving he was told there would be a charge of $99. Pt was very upset that he was not informed of charge previous to this visit and would have liked to have been informed. Pt is requesting someone look into why this was the case and make sure that it does not happen again in the future. He is not looking for a call back, but a note somewhere making sure he is to be notified before any visit in regards to payments and/or non-coverage.

## 2022-08-14 ENCOUNTER — Encounter: Payer: Self-pay | Admitting: Gastroenterology

## 2022-08-27 ENCOUNTER — Telehealth: Payer: Self-pay | Admitting: *Deleted

## 2022-08-27 NOTE — Telephone Encounter (Signed)
Pt.scheduled as direct procedure on 10/14/22,pre-visit 09/16/22,seeing cardiology on 09/02/22,please review pt.chart and advise if need OV or if can proceed with direct?

## 2022-08-29 NOTE — Telephone Encounter (Signed)
NOTED

## 2022-09-02 ENCOUNTER — Ambulatory Visit: Payer: Medicare Other | Admitting: Cardiovascular Disease

## 2022-09-03 ENCOUNTER — Other Ambulatory Visit: Payer: Self-pay | Admitting: Cardiovascular Disease

## 2022-09-16 ENCOUNTER — Ambulatory Visit (AMBULATORY_SURGERY_CENTER): Payer: Medicare Other | Admitting: *Deleted

## 2022-09-16 VITALS — Ht 70.0 in | Wt 196.0 lb

## 2022-09-16 DIAGNOSIS — Z1211 Encounter for screening for malignant neoplasm of colon: Secondary | ICD-10-CM

## 2022-09-16 MED ORDER — NA SULFATE-K SULFATE-MG SULF 17.5-3.13-1.6 GM/177ML PO SOLN: 1.0000 | Freq: Once | ORAL | 0 refills | Status: AC

## 2022-09-16 NOTE — Progress Notes (Signed)
No egg or soy allergy known to patient  No issues known to pt with past sedation with any surgeries or procedures Patient denies ever being told they had issues or difficulty with intubation  No FH of Malignant Hyperthermia Pt is not on diet pills Pt is not on  home 02  Pt is not on blood thinners  Pt denies issues with constipation  No A fib or A flutter Have any cardiac testing pending--no Pt instructed to use Singlecare.com or GoodRx for a price reduction on prep  Patient's chart reviewed by Phillip Jackson CNRA prior to previsit and patient appropriate for the [facility].  Previsit completed and red dot placed by patient's name on their procedure day (on provider's schedule).    

## 2022-09-22 ENCOUNTER — Encounter: Payer: Self-pay | Admitting: Cardiovascular Disease

## 2022-09-22 ENCOUNTER — Ambulatory Visit: Payer: Medicare Other | Attending: Cardiovascular Disease | Admitting: Cardiovascular Disease

## 2022-09-22 VITALS — BP 132/80 | HR 70 | Ht 71.0 in | Wt 200.6 lb

## 2022-09-22 DIAGNOSIS — E782 Mixed hyperlipidemia: Secondary | ICD-10-CM | POA: Insufficient documentation

## 2022-09-22 DIAGNOSIS — Z8249 Family history of ischemic heart disease and other diseases of the circulatory system: Secondary | ICD-10-CM | POA: Insufficient documentation

## 2022-09-22 DIAGNOSIS — R002 Palpitations: Secondary | ICD-10-CM | POA: Insufficient documentation

## 2022-09-22 NOTE — Progress Notes (Signed)
09/22/2022 Phillip Jackson   06-12-1951  [id number]  Primary Physician Hilts, Legrand Como, MD Primary Cardiologist: Phillip Harp MD Renae Gloss  HPI:  Phillip Jackson is a 72 y.o.   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  He was referred by Dr. Junius Jackson for cardiovascular valuation because of palpitations.  He is a previous patient of Dr. Jeralene Peters Jackson's who he last saw 06/28/2013 for chest pain.  I last saw him in the office 05/09/2022.  His cardiac risk factors are notable for hyperlipidemia not on statin therapy and family history of heart disease with father had a myocardial infarction and CABG at age 53 and died 44 years later.  He is never had a heart attack or stroke.  He denies chest pain or shortness of breath.  He did have a normal Myoview stress test performed 07/20/2013.  He does complain of nocturnal palpitations but really denies chest pain.  He did have an event monitor performed 08/26/2019 revealing 6 episodes of SVT longest of which was 20 beats at 190 bpm with a PVC burden of less than 1%.  He is very active during the day around the yard and doing projects and is completely asymptomatic during his activities.   Since I saw him 4 months ago he was supposed to get a coronary calcium score but because of the logistical issues was unable to put get this done.  He does admit to drinking a lot of caffeine on a daily basis.  He had an of event monitor performed 05/09/2022 that showed frequent PVCs with 5.8% burden and 1 run of PSVT.  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  He is totally asymptomatic.  Current Meds  Medication Sig   aspirin EC 81 MG tablet Take 81 mg by mouth daily. Swallow whole.   Colchicine (MITIGARE) 0.6 MG CAPS Take 1 capsule by mouth 2 (two) times daily as needed. (Patient taking differently:  Take 1 capsule by mouth daily.)   DULoxetine (CYMBALTA) 20 MG capsule Take 1 capsule (20 mg total) by mouth daily.   ketoconazole (NIZORAL) 2 % shampoo Apply topically every other day.   NITROSTAT 0.4 MG SL tablet Place under the tongue.   rosuvastatin (CRESTOR) 20 MG tablet Take 1 tablet (20 mg total) by mouth daily. Keep scheduled appointment for further refills     Allergies  Allergen Reactions   Codeine     REACTION: hallucinations    Social History   Socioeconomic History   Marital status: Married    Spouse name: Not on file   Number of children: Not on file   Years of education: Not on file   Highest education level: Not on file  Occupational History   Not on file  Tobacco Use   Smoking status: Never   Smokeless tobacco: Never  Vaping Use   Vaping Use: Never used  Substance and Sexual Activity   Alcohol use: Yes    Comment: wine with dinner   Drug use: No   Sexual activity: Not on file  Other Topics Concern   Not on file  Social History Narrative   Not on file   Social Determinants of Health   Financial Resource Strain: Not on file  Food Insecurity: Not on file  Transportation Needs: Not on file  Physical Activity: Not on  file  Stress: Not on file  Social Connections: Not on file  Intimate Partner Violence: Not on file     Review of Systems: General: negative for chills, fever, night sweats or weight changes.  Cardiovascular: negative for chest pain, dyspnea on exertion, edema, orthopnea, palpitations, paroxysmal nocturnal dyspnea or shortness of breath Dermatological: negative for rash Respiratory: negative for cough or wheezing Urologic: negative for hematuria Abdominal: negative for nausea, vomiting, diarrhea, bright red blood per rectum, melena, or hematemesis Neurologic: negative for visual changes, syncope, or dizziness All other systems reviewed and are otherwise negative except as noted above.    Blood pressure 132/80, pulse 70, height 5\' 11"   (1.803 m), weight 200 lb 9.6 oz (91 kg), SpO2 99 %.  General appearance: alert and no distress Neck: no adenopathy, no carotid bruit, no JVD, supple, symmetrical, trachea midline, and thyroid not enlarged, symmetric, no tenderness/mass/nodules Lungs: clear to auscultation bilaterally Heart: regular rate and rhythm, S1, S2 normal, no murmur, click, rub or gallop Extremities: extremities normal, atraumatic, no cyanosis or edema Pulses: 2+ and symmetric Skin: Skin color, texture, turgor normal. No rashes or lesions Neurologic: Grossly normal  EKG not performed today  ASSESSMENT AND PLAN:   Hyperlipidemia History of hyperlipidemia on rosuvastatin 20 mg a day.  His last lipid profile performed 05/09/2022 revealed total cholesterol 226, LDL 155 and HDL 60.  Will recheck a lipid liver profile.  Palpitations History of palpitations with event monitor performed 05/09/2022 revealing frequent PVCs with 6% burden and short run of SVT.  He is not on a beta-blocker.  He does admit to a lot of caffeine use which I told him to limit.  I will see him back in 6 months.  If after curtailing his caffeine intake he still is having palpitations we will add a low-dose beta-blocker.     Phillip Harp MD FACP,FACC,FAHA, [facility] 09/22/2022 2:28 PM

## 2022-09-22 NOTE — Patient Instructions (Signed)
Medication Instructions:  Your physician recommends that you continue on your current medications as directed. Please refer to the Current Medication list given to you today.  *If you need a refill on your cardiac medications before your next appointment, please call your pharmacy*   Lab Work: Your physician recommends that you return for lab work in: the next couple of week for FASTING lipid/liver panel  If you have labs (blood work) drawn today and your tests are completely normal, you will receive your results only by: [place] (if you have MyChart) OR A paper copy in the mail If you have any lab test that is abnormal or we need to change your treatment, we will call you to review the results.   Testing/Procedures: Dr. Gwenlyn Found has ordered a CT coronary calcium score.   Test locations:  [place]   This is $99 out of pocket.   Coronary CalciumScan A coronary calcium scan is an imaging test used to look for deposits of calcium and other fatty materials (plaques) in the inner lining of the blood vessels of the heart (coronary arteries). These deposits of calcium and plaques can partly clog and narrow the coronary arteries without producing any symptoms or warning signs. This puts a person at risk for a heart attack. This test can detect these deposits before symptoms develop. Tell a health care provider about: Any allergies you have. All medicines you are taking, including vitamins, herbs, eye drops, creams, and over-the-counter medicines. Any problems you or family members have had with anesthetic medicines. Any blood disorders you have. Any surgeries you have had. Any medical conditions you have. Whether you are pregnant or may be pregnant. What are the risks? Generally, this is a safe procedure. However, problems may occur, including: Harm to a pregnant woman and her unborn baby. This test involves the use of radiation. Radiation exposure  can be dangerous to a pregnant woman and her unborn baby. If you are pregnant, you generally should not have this procedure done. Slight increase in the risk of cancer. This is because of the radiation involved in the test. What happens before the procedure? No preparation is needed for this procedure. What happens during the procedure? You will undress and remove any jewelry around your neck or chest. You will put on a hospital gown. Sticky electrodes will be placed on your chest. The electrodes will be connected to an electrocardiogram (ECG) machine to record a tracing of the electrical activity of your heart. A CT scanner will take pictures of your heart. During this time, you will be asked to lie still and hold your breath for 2-3 seconds while a picture of your heart is being taken. The procedure may vary among health care providers and hospitals. What happens after the procedure? You can get dressed. You can return to your normal activities. It is up to you to get the results of your test. Ask your health care provider, or the department that is doing the test, when your results will be ready. Summary A coronary calcium scan is an imaging test used to look for deposits of calcium and other fatty materials (plaques) in the inner lining of the blood vessels of the heart (coronary arteries). Generally, this is a safe procedure. Tell your health care provider if you are pregnant or may be pregnant. No preparation is needed for this procedure. A CT scanner will take pictures of your heart. You can return to your normal activities after the  scan is done. This information is not intended to replace advice given to you by your health care provider. Make sure you discuss any questions you have with your health care provider. Document Released: 12/20/2007 Document Revised: 05/12/2016 Document Reviewed: 05/12/2016 Elsevier Interactive Patient Education  2017 [place]: At [facility], you and your health needs are our priority.  As part of our continuing mission to provide you with exceptional heart care, we have created designated Provider Care Teams.  These Care Teams include your primary Cardiologist (physician) and Advanced Practice Providers (APPs -  Physician Assistants and Nurse Practitioners) who all work together to provide you with the care you need, when you need it.  We recommend signing up for the patient portal called "MyChart".  Sign up information is provided on this After Visit Summary.  MyChart is used to connect with patients for Virtual Visits (Telemedicine).  Patients are able to view lab/test results, encounter notes, upcoming appointments, etc.  Non-urgent messages can be sent to your provider as well.   To learn more about what you can do with MyChart, go to [email or web address].    Your next appointment:   6 month(s)  Provider:   Quay Burow, MD

## 2022-09-22 NOTE — Assessment & Plan Note (Signed)
History of hyperlipidemia on rosuvastatin 20 mg a day.  His last lipid profile performed 05/09/2022 revealed total cholesterol 226, LDL 155 and HDL 60.  Will recheck a lipid liver profile.

## 2022-09-22 NOTE — Assessment & Plan Note (Signed)
History of palpitations with event monitor performed 05/09/2022 revealing frequent PVCs with 6% burden and short run of SVT.  He is not on a beta-blocker.  He does admit to a lot of caffeine use which I told him to limit.  I will see him back in 6 months.  If after curtailing his caffeine intake he still is having palpitations we will add a low-dose beta-blocker.

## 2022-09-24 LAB — HEPATIC FUNCTION PANEL
ALT: 25 IU/L (ref 0–44)
AST: 32 IU/L (ref 0–40)
Albumin: 4.1 g/dL (ref 3.8–4.8)
Alkaline Phosphatase: 67 IU/L (ref 44–121)
Bilirubin Total: 0.4 mg/dL (ref 0.0–1.2)
Bilirubin, Direct: 0.15 mg/dL (ref 0.00–0.40)
Total Protein: 6.7 g/dL (ref 6.0–8.5)

## 2022-09-24 LAB — LIPID PANEL
Chol/HDL Ratio: 2.4 ratio (ref 0.0–5.0)
Cholesterol, Total: 140 mg/dL (ref 100–199)
HDL: 59 mg/dL (ref >39)
LDL Chol Calc (NIH): 62 mg/dL (ref 0–99)
Triglycerides: 104 mg/dL (ref 0–149)
VLDL Cholesterol Cal: 19 mg/dL (ref 5–40)

## 2022-10-14 ENCOUNTER — Encounter: Payer: Medicare Other | Admitting: Gastroenterology

## 2022-10-15 ENCOUNTER — Other Ambulatory Visit: Payer: Self-pay | Admitting: Cardiovascular Disease

## 2022-10-23 ENCOUNTER — Other Ambulatory Visit (HOSPITAL_BASED_OUTPATIENT_CLINIC_OR_DEPARTMENT_OTHER): Payer: Medicare Other

## 2022-11-26 ENCOUNTER — Encounter: Payer: Medicare Other | Admitting: Gastroenterology

## 2022-12-23 ENCOUNTER — Telehealth: Payer: Self-pay | Admitting: Gastroenterology

## 2022-12-23 NOTE — Telephone Encounter (Signed)
Patient called in, stating he just came back form a trip and he said he have the Flue , he is not sure if he needs to reschedule tomorrow's procedure.Please advise.

## 2022-12-23 NOTE — Telephone Encounter (Signed)
Returned the patient's phone call. Rescheduled him for June 28 at 11:30 am. Gave him updated times for his prep. Pt verbalized understanding.

## 2022-12-24 ENCOUNTER — Encounter: Payer: Medicare Other | Admitting: Gastroenterology

## 2023-01-02 ENCOUNTER — Encounter: Payer: Medicare Other | Admitting: Gastroenterology

## 2023-01-06 ENCOUNTER — Ambulatory Visit: Payer: Medicare Other | Admitting: Cardiovascular Disease

## 2023-02-25 ENCOUNTER — Encounter: Payer: Medicare Other | Admitting: Gastroenterology

## 2023-04-15 ENCOUNTER — Ambulatory Visit: Payer: Medicare Other

## 2023-04-15 NOTE — Progress Notes (Unsigned)
04/15/23, 0831: RN attempted pre-visit call for upcoming colonoscopy. Patient phone rang multiple times, then went to a busy signal; unable to leave vm.  04/15/23, 0846: RN made second attempt to contact patient without success.  Patient did not call to reschedule appointment. Colonoscopy cancellation letter sent to patient.

## 2023-04-29 ENCOUNTER — Encounter: Payer: Medicare Other | Admitting: Gastroenterology

## 2023-09-03 ENCOUNTER — Telehealth (HOSPITAL_COMMUNITY): Payer: Self-pay | Admitting: Vascular Surgery

## 2023-09-03 NOTE — Telephone Encounter (Signed)
 Lvm @ referring office that pt is not appropriate for [facility]

## 2023-09-04 ENCOUNTER — Telehealth: Payer: Self-pay | Admitting: Cardiovascular Disease

## 2023-09-04 NOTE — Telephone Encounter (Signed)
 Patient is looking to switch from Dr. Allyson Sabal to Dr. Clifton James. Please call to confirm

## 2023-09-07 ENCOUNTER — Telehealth: Payer: Self-pay

## 2023-09-07 NOTE — Telephone Encounter (Signed)
 VOB submitted for Monovisc, left knee

## 2023-09-10 ENCOUNTER — Encounter: Payer: Self-pay | Admitting: Orthopedic Surgery

## 2023-09-10 ENCOUNTER — Other Ambulatory Visit (INDEPENDENT_AMBULATORY_CARE_PROVIDER_SITE_OTHER): Payer: Self-pay

## 2023-09-10 ENCOUNTER — Ambulatory Visit (INDEPENDENT_AMBULATORY_CARE_PROVIDER_SITE_OTHER): Payer: Medicare Other | Admitting: Orthopedic Surgery

## 2023-09-10 ENCOUNTER — Other Ambulatory Visit: Payer: Self-pay

## 2023-09-10 DIAGNOSIS — M25562 Pain in left knee: Secondary | ICD-10-CM | POA: Diagnosis not present

## 2023-09-10 DIAGNOSIS — M1712 Unilateral primary osteoarthritis, left knee: Secondary | ICD-10-CM | POA: Diagnosis not present

## 2023-09-11 ENCOUNTER — Encounter: Payer: Self-pay | Admitting: Orthopedic Surgery

## 2023-09-11 LAB — SYNOVIAL FLUID, CRYSTAL

## 2023-09-11 LAB — TIQ-NTM

## 2023-09-11 NOTE — Progress Notes (Signed)
 Office Visit Note   Patient: Phillip Jackson           Date of Birth: 09-Feb-1951           MRN: [id number] Visit Date: 09/10/2023 Requested by: Lavada Mesi, MD [address] [place],  [place] [address] PCP: Lavada Mesi, MD  Subjective: Chief Complaint  Patient presents with   Left Knee - Pain    HPI: Phillip Jackson is a 73 y.o. male who presents to the office reporting left knee pain.  Patient describes severe pain since he came back from a hunting trip in [place] in January.  Hard for him to walk about 3 weeks after that.  Did have an injection of dextrose which did not help much.  3 days ago however he started taking gout medication and that has improved his left knee symptoms.  This is on the advice of his wife.  3 out of 10 pain currently.  Planning trip to [place] next year.  He does enjoy hunting and goes on for 5 hunting trips which sound extensive per year.  Does have to walk in hot for 7 to 10 days at a time.  Gel shots of helped him in the past.  His wife is retired..                ROS: All systems reviewed are negative as they relate to the chief complaint within the history of present illness.  Patient denies fevers or chills.  Assessment & Plan: Visit Diagnoses:  1. Left knee pain, unspecified chronicity     Plan: Impression is worsening left knee arthritis with narrowing of the joint space and close to bone-on-bone changes particularly in that left knee.  No effusion today.  Monovisc injection performed.  Fluid is aspirated and sent for crystal analysis and is negative.  Patient does have excellent quad strength but does have some varus alignment which is likely aggravating his medial compartment arthritis.  He will follow-up as needed.  Follow-Up Instructions: No follow-ups on file.   Orders:  Orders Placed This Encounter  Procedures   XR KNEE 3 VIEW LEFT   Synovial fluid, crystal   TIQ-NTM   No orders of the defined types were placed in  this encounter.     Procedures: Large Joint Inj: L knee on 09/10/2023 8:46 AM Indications: pain, joint swelling and diagnostic evaluation Details: 18 G 1.5 in needle, superolateral approach  Arthrogram: No  Medications: 5 mL lidocaine 1 %; 88 mg Hyaluronan 88 MG/4ML Outcome: tolerated well, no immediate complications Procedure, treatment alternatives, risks and benefits explained, specific risks discussed. Consent was given by the patient. Immediately prior to procedure a time out was called to verify the correct patient, procedure, equipment, support staff and site/side marked as required. Patient was prepped and draped in the usual sterile fashion.     Lot #[id number]  Clinical Data: No additional findings.  Objective: Vital Signs: There were no vitals taken for this visit.  Physical Exam:  Constitutional: Patient appears well-developed HEENT:  Head: Normocephalic Eyes:EOM are normal Neck: Normal range of motion Cardiovascular: Normal rate Pulmonary/chest: Effort normal Neurologic: Patient is alert Skin: Skin is warm Psychiatric: Patient has normal mood and affect  Ortho Exam: Ortho exam demonstrates about 3 degrees from full extension as well as full flexion.  Collaterals are stable and cruciate ligaments are stable on the left-hand side.  No groin pain with internal/external Tatian leg.  Ankle dorsiflexion intact.  Pedal pulses intact.  Has medial greater than lateral joint line tenderness.  Specialty Comments:  No specialty comments available.  Imaging: No results found.   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   OA (osteoarthritis)     Family History  Problem Relation Age of Onset   Hypertension Mother    Heart attack Father 24   Other Father        open heart surgery   Diabetes Father    Diabetes Brother    Hypertension Maternal Aunt    Heart disease Other        uncles on both sides of famiily   Cancer Neg Hx    Colon cancer Neg Hx    Colon polyps Neg Hx    Crohn's disease Neg Hx    Esophageal cancer Neg Hx    Rectal cancer Neg Hx    Stomach cancer Neg Hx    Ulcerative colitis Neg Hx     Past Surgical History:  Procedure Laterality Date   KNEE SURGERY     multiple   LEFT HEART CATH  2001   ORTHOPEDIC SURGERY     several   Social History   Occupational History   Not on file  Tobacco Use   Smoking status: Never   Smokeless tobacco: Never  Vaping Use   Vaping status: Never Used  Substance and Sexual Activity   Alcohol use: Yes    Comment: wine with dinner   Drug use: No   Sexual activity: Not on file

## 2023-09-11 NOTE — Progress Notes (Signed)
 No gout crystals pls call thx

## 2023-09-12 MED ORDER — HYALURONAN 88 MG/4ML IX SOSY
88.0000 mg | PREFILLED_SYRINGE | INTRA_ARTICULAR | Status: AC | PRN
  Administered 2023-09-10: 88 mg via INTRA_ARTICULAR

## 2023-09-12 MED ORDER — LIDOCAINE HCL 1 % IJ SOLN
5.0000 mL | INTRAMUSCULAR | Status: AC | PRN
  Administered 2023-09-10: 5 mL

## 2023-09-14 ENCOUNTER — Telehealth: Payer: Self-pay | Admitting: Orthopedic Surgery

## 2023-09-14 NOTE — Telephone Encounter (Signed)
 Patient called requesting for a call back please. Phillip Jackson [phone]

## 2023-09-15 NOTE — Telephone Encounter (Signed)
 IC patient, no answer. No VM to LM.

## 2023-09-16 ENCOUNTER — Other Ambulatory Visit: Payer: Self-pay | Admitting: Family Medicine

## 2023-09-16 DIAGNOSIS — M25562 Pain in left knee: Secondary | ICD-10-CM

## 2023-10-21 ENCOUNTER — Other Ambulatory Visit: Payer: Self-pay | Admitting: Physical Medicine and Rehabilitation

## 2023-10-21 DIAGNOSIS — R202 Paresthesia of skin: Secondary | ICD-10-CM

## 2023-10-24 ENCOUNTER — Other Ambulatory Visit: Payer: Self-pay | Admitting: Cardiovascular Disease

## 2023-10-28 ENCOUNTER — Encounter: Payer: Self-pay | Admitting: Physical Medicine and Rehabilitation

## 2023-10-28 ENCOUNTER — Ambulatory Visit (INDEPENDENT_AMBULATORY_CARE_PROVIDER_SITE_OTHER): Admitting: Physical Medicine and Rehabilitation

## 2023-10-28 ENCOUNTER — Other Ambulatory Visit (HOSPITAL_COMMUNITY): Payer: Self-pay | Admitting: Family Medicine

## 2023-10-28 DIAGNOSIS — R269 Unspecified abnormalities of gait and mobility: Secondary | ICD-10-CM

## 2023-10-28 DIAGNOSIS — R202 Paresthesia of skin: Secondary | ICD-10-CM

## 2023-10-28 DIAGNOSIS — R200 Anesthesia of skin: Secondary | ICD-10-CM

## 2023-10-28 DIAGNOSIS — M5440 Lumbago with sciatica, unspecified side: Secondary | ICD-10-CM

## 2023-10-28 NOTE — Progress Notes (Signed)
 Pain Scale   Average Pain 5 Patient advising that he has constant tingling and numbness bilaterally in his feet and toes.        +Driver, -BT, -Dye Allergies.

## 2023-10-29 NOTE — Procedures (Signed)
 EMG & NCV Findings: Evaluation of the left fibular motor nerve showed reduced amplitude (1.4 mV).  The left sural sensory and the right sural sensory nerves showed prolonged distal peak latency (L4.7, R4.4 ms) and decreased conduction velocity (Calf-Lat Mall, L30, R32 m/s).  All remaining nerves (as indicated in the following tables) were within normal limits.  Left vs. Right side comparison data for the fibular motor nerve indicates abnormal L-R amplitude difference (71.4 %).  All remaining left vs. right side differences were within normal limits.    All examined muscles (as indicated in the following table) showed no evidence of electrical instability.    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  There is no significant electrodiagnostic evidence of any other focal nerve entrapment, lumbosacral plexopathy or generalized peripheral neuropathy.  Recommendations: 1.  Follow-up with referring physician. 2.  Continue current management of symptoms. 3.  Suggest MRI Lumbar spine.  ___________________________ Phillip Jackson Board Certified, American Board of Physical Medicine and Rehabilitation    Nerve Conduction Studies Anti Sensory Summary Table   Stim Site NR Peak (ms) Norm Peak (ms) P-T Amp (V) Norm P-T Amp Site1 Site2 Delta-P (ms) Dist (cm) Vel (m/s) Norm Vel (m/s)  Left Saphenous Anti Sensory (Ant Med Mall)  26.7C  14cm    4.3 <4.4 7.5 >2 14cm Ant Med Mall 4.3 0.0  >32  Right Saphenous Anti Sensory (Ant Med Mall)  26.8C  14cm    4.4 <4.4 12.1 >2 14cm Ant Med Mall 4.4 0.0  >32  Left Sup Fibular Anti Sensory (Ant Lat Mall)  29.8C  14 cm    4.4 <4.4 11.2 >5.0 14 cm Ant Lat Mall 4.4 14.0 32 >32  Right Sup Fibular Anti Sensory (Ant Lat Mall)  26.4C  14 cm    4.4 <4.4 5.4 >5.0 14 cm Ant Lat Mall 4.4 14.0 32 >32  Left Sural Anti Sensory (Lat Mall)  22.8C  Calf    *4.7 <4.0 5.6 >5.0 Calf  Lat Mall 4.7 14.0 *30 >35  Site 2    4.6  7.8         Right Sural Anti Sensory (Lat Mall)  26.1C  Calf    *4.4 <4.0 6.5 >5.0 Calf Lat Mall 4.4 14.0 *32 >35   Motor Summary Table   Stim Site NR Onset (ms) Norm Onset (ms) O-P Amp (mV) Norm O-P Amp Site1 Site2 Delta-0 (ms) Dist (cm) Vel (m/s) Norm Vel (m/s)  Left Fibular Motor (Ext Dig Brev)  26.9C  Ankle    5.6 <6.1 *1.4 >2.5 B Fib Ankle 8.0 31.0 39 >38  B Fib    13.6  3.7  Poplt B Fib 2.4 10.0 42 >40  Poplt    16.0  3.7         Right Fibular Motor (Ext Dig Brev)  26.8C  Ankle    5.8 <6.1 4.9 >2.5 B Fib Ankle 8.3 33.0 40 >38  B Fib    14.1  5.1  Poplt B Fib 2.2 9.0 41 >40  Poplt    16.3  4.8         Left Tibial Motor (Abd Hall Brev)  27C  Ankle    5.9 <6.1 3.4 >3.0 Knee Ankle 9.2 39.0 42 >35  Knee    15.1  0.8         Right Tibial Motor (Abd Hall Brev)  26.6C  Ankle  5.2 <6.1 3.6 >3.0 Knee Ankle 9.2 41.0 45 >35  Knee    14.4  4.9          EMG   Side Muscle Nerve Root Ins Act Fibs Psw Amp Dur Poly Recrt Int Deatra Face Comment  Left AntTibialis Dp Br Peron L4-5 Nml Nml Nml Nml Nml 0 Nml Nml   Left Fibularis Longus  Sup Br Peron L5-S1 Nml Nml Nml Nml Nml 0 Nml Nml   Left MedGastroc Tibial S1-2 Inc Nml Nml Nml Nml 0 Nml Nml   Left VastusMed Femoral L2-4 Nml Nml Nml Nml Nml 0 Nml Nml   Left BicepsFemS Sciatic L5-S1 Nml Nml Nml Nml Nml 0 Nml Nml   Right AntTibialis Dp Br Peron L4-5 Nml Nml Nml Nml Nml 0 Nml Nml   Right Fibularis Longus  Sup Br Peron L5-S1 Nml Nml Nml Nml Nml 0 Nml Nml   Right MedGastroc Tibial S1-2 Nml Nml Nml Nml Nml 0 Nml Nml   Right VastusMed Femoral L2-4 Nml Nml Nml Nml Nml 0 Nml Nml   Right BicepsFemS Sciatic L5-S1 Nml Nml Nml Nml Nml 0 Nml Nml     Nerve Conduction Studies Anti Sensory Left/Right Comparison   Stim Site L Lat (ms) R Lat (ms) L-R Lat (ms) L Amp (V) R Amp (V) L-R Amp (%) Site1 Site2 L Vel (m/s) R Vel (m/s) L-R Vel (m/s)  Saphenous Anti Sensory (Ant Med Mall)  26.7C  14cm 4.3 4.4 0.1 7.5 12.1  38.0 14cm Ant Med Mall     Sup Fibular Anti Sensory (Ant Lat Mall)  29.8C  14 cm 4.4 4.4 0.0 11.2 5.4 51.8 14 cm Ant Lat Mall 32 32 0  Sural Anti Sensory (Lat Mall)  22.8C  Calf *4.7 *4.4 0.3 5.6 6.5 13.8 Calf Lat Mall *30 *32 2  Site 2 4.6   7.8          Motor Left/Right Comparison   Stim Site L Lat (ms) R Lat (ms) L-R Lat (ms) L Amp (mV) R Amp (mV) L-R Amp (%) Site1 Site2 L Vel (m/s) R Vel (m/s) L-R Vel (m/s)  Fibular Motor (Ext Dig Brev)  26.9C  Ankle 5.6 5.8 0.2 *1.4 4.9 *71.4 B Fib Ankle 39 40 1  B Fib 13.6 14.1 0.5 3.7 5.1 27.5 Poplt B Fib 42 41 1  Poplt 16.0 16.3 0.3 3.7 4.8 22.9       Tibial Motor (Abd Hall Brev)  27C  Ankle 5.9 5.2 0.7 3.4 3.6 5.6 Knee Ankle 42 45 3  Knee 15.1 14.4 0.7 0.8 4.9 83.7          Waveforms:

## 2023-10-29 NOTE — Progress Notes (Signed)
 Phillip Jackson - 73 y.o. male MRN [id number]  Date of birth: 11/30/1950  Office Visit Note: Visit Date: 10/28/2023 PCP: Rey Catholic, MD Referred by: Rey Catholic, MD  Subjective: Chief Complaint  Patient presents with   Left Foot - Numbness, Weakness   Right Foot - Numbness, Weakness   HPI: Phillip Jackson is a 73 y.o. male who comes in today at the request of Dr. Rey Catholic for evaluation and management of chronic, worsening and severe pain, numbness and tingling in the Bilateral lower extremities.  Patient is Right hand dominant.  Referral and messaging from Dr. Armandina Bernard indicated the patient had bilateral foot numbness and tingling with some balance difficulties and just inability to feel his feet at times when he was touching the ground.  History from the patient today is slightly different and that he is having some back pain in general and particularly right more than left he has symptoms over the back of the leg back of the calf with paresthesias into the bottom of the foot and lateral foot again more right than the left.  No focal weakness or foot drop.  No specific pain more tingling numbness.  He is wondering if it is related to disabilities from his service.  He does use [facility] for a lot of his care.  He is a very active individual and exercises quite a bit and tries to stay pretty healthy.  Has history of gout.  Does not have a history of lumbar surgery.  He is followed by Dr. Rozelle Corning in our office for his knees.  He has not had recent imaging of his lumbar spine.   I spent more than 30 minutes speaking face-to-face with the patient with 50% of the time in counseling and discussing coordination of care.       Review of Systems  Musculoskeletal:  Positive for back pain.  Neurological:  Positive for tingling.  All other systems reviewed and are negative.  Otherwise per HPI.  Assessment & Plan: Visit Diagnoses:    ICD-10-CM   1. Paresthesia of skin  R20.2 NCV with EMG  (electromyography)    2. Numbness and tingling of foot  R20.0    R20.2     3. Gait abnormality  R26.9        Plan: Impression: Clinically after talking with him it did seem to be more of a radicular type symptoms into the right more than left leg in a somewhat classic S1 distribution but could be related to a peripheral polyneuropathy etc.  Not sure how this would be service related per se but he can follow-up with his doctors at the [facility].  Electrodiagnostic study was performed today.  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  There is no significant electrodiagnostic evidence of any other focal nerve entrapment, lumbosacral plexopathy or generalized peripheral neuropathy  Recommendations: 1.  Follow-up with referring physician. 2.  Continue current management of symptoms. 3.  Suggest MRI Lumbar spine.  Meds & Orders: No orders of the defined types were placed in this encounter.   Orders Placed This Encounter  Procedures   NCV with EMG (electromyography)    Follow-up: Return for Rey Catholic, MD.   Procedures: No procedures performed  EMG & NCV Findings: Evaluation of the left fibular motor nerve showed reduced amplitude (1.4 mV).  The left sural sensory and the right sural sensory nerves showed prolonged distal peak latency (L4.7,  R4.4 ms) and decreased conduction velocity (Calf-Lat Mall, L30, R32 m/s).  All remaining nerves (as indicated in the following tables) were within normal limits.  Left vs. Right side comparison data for the fibular motor nerve indicates abnormal L-R amplitude difference (71.4 %).  All remaining left vs. right side differences were within normal limits.    All examined muscles (as indicated in the following table) showed no evidence of electrical instability.    Impression: The above electrodiagnostic study is ABNORMAL and reveals evidence of mild chronic S1 radiculopathy on the  right and left.  There is no significant electrodiagnostic evidence of any other focal nerve entrapment, lumbosacral plexopathy or generalized peripheral neuropathy.  Recommendations: 1.  Follow-up with referring physician. 2.  Continue current management of symptoms. 3.  Suggest MRI Lumbar spine.  ___________________________ Tsosie Gail Board Certified, American Board of Physical Medicine and Rehabilitation    Nerve Conduction Studies Anti Sensory Summary Table   Stim Site NR Peak (ms) Norm Peak (ms) P-T Amp (V) Norm P-T Amp Site1 Site2 Delta-P (ms) Dist (cm) Vel (m/s) Norm Vel (m/s)  Left Saphenous Anti Sensory (Ant Med Mall)  26.7C  14cm    4.3 <4.4 7.5 >2 14cm Ant Med Mall 4.3 0.0  >32  Right Saphenous Anti Sensory (Ant Med Mall)  26.8C  14cm    4.4 <4.4 12.1 >2 14cm Ant Med Mall 4.4 0.0  >32  Left Sup Fibular Anti Sensory (Ant Lat Mall)  29.8C  14 cm    4.4 <4.4 11.2 >5.0 14 cm Ant Lat Mall 4.4 14.0 32 >32  Right Sup Fibular Anti Sensory (Ant Lat Mall)  26.4C  14 cm    4.4 <4.4 5.4 >5.0 14 cm Ant Lat Mall 4.4 14.0 32 >32  Left Sural Anti Sensory (Lat Mall)  22.8C  Calf    *4.7 <4.0 5.6 >5.0 Calf Lat Mall 4.7 14.0 *30 >35  Site 2    4.6  7.8         Right Sural Anti Sensory (Lat Mall)  26.1C  Calf    *4.4 <4.0 6.5 >5.0 Calf Lat Mall 4.4 14.0 *32 >35   Motor Summary Table   Stim Site NR Onset (ms) Norm Onset (ms) O-P Amp (mV) Norm O-P Amp Site1 Site2 Delta-0 (ms) Dist (cm) Vel (m/s) Norm Vel (m/s)  Left Fibular Motor (Ext Dig Brev)  26.9C  Ankle    5.6 <6.1 *1.4 >2.5 B Fib Ankle 8.0 31.0 39 >38  B Fib    13.6  3.7  Poplt B Fib 2.4 10.0 42 >40  Poplt    16.0  3.7         Right Fibular Motor (Ext Dig Brev)  26.8C  Ankle    5.8 <6.1 4.9 >2.5 B Fib Ankle 8.3 33.0 40 >38  B Fib    14.1  5.1  Poplt B Fib 2.2 9.0 41 >40  Poplt    16.3  4.8         Left Tibial Motor (Abd Hall Brev)  27C  Ankle    5.9 <6.1 3.4 >3.0 Knee Ankle 9.2 39.0 42 >35  Knee    15.1  0.8          Right Tibial Motor (Abd Hall Brev)  26.6C  Ankle    5.2 <6.1 3.6 >3.0 Knee Ankle 9.2 41.0 45 >35  Knee    14.4  4.9          EMG   Side  Muscle Nerve Root Ins Act Fibs Psw Amp Dur Poly Recrt Int Deatra Face Comment  Left AntTibialis Dp Br Peron L4-5 Nml Nml Nml Nml Nml 0 Nml Nml   Left Fibularis Longus  Sup Br Peron L5-S1 Nml Nml Nml Nml Nml 0 Nml Nml   Left MedGastroc Tibial S1-2 Inc Nml Nml Nml Nml 0 Nml Nml   Left VastusMed Femoral L2-4 Nml Nml Nml Nml Nml 0 Nml Nml   Left BicepsFemS Sciatic L5-S1 Nml Nml Nml Nml Nml 0 Nml Nml   Right AntTibialis Dp Br Peron L4-5 Nml Nml Nml Nml Nml 0 Nml Nml   Right Fibularis Longus  Sup Br Peron L5-S1 Nml Nml Nml Nml Nml 0 Nml Nml   Right MedGastroc Tibial S1-2 Nml Nml Nml Nml Nml 0 Nml Nml   Right VastusMed Femoral L2-4 Nml Nml Nml Nml Nml 0 Nml Nml   Right BicepsFemS Sciatic L5-S1 Nml Nml Nml Nml Nml 0 Nml Nml     Nerve Conduction Studies Anti Sensory Left/Right Comparison   Stim Site L Lat (ms) R Lat (ms) L-R Lat (ms) L Amp (V) R Amp (V) L-R Amp (%) Site1 Site2 L Vel (m/s) R Vel (m/s) L-R Vel (m/s)  Saphenous Anti Sensory (Ant Med Mall)  26.7C  14cm 4.3 4.4 0.1 7.5 12.1 38.0 14cm Ant Med Mall     Sup Fibular Anti Sensory (Ant Lat Mall)  29.8C  14 cm 4.4 4.4 0.0 11.2 5.4 51.8 14 cm Ant Lat Mall 32 32 0  Sural Anti Sensory (Lat Mall)  22.8C  Calf *4.7 *4.4 0.3 5.6 6.5 13.8 Calf Lat Mall *30 *32 2  Site 2 4.6   7.8          Motor Left/Right Comparison   Stim Site L Lat (ms) R Lat (ms) L-R Lat (ms) L Amp (mV) R Amp (mV) L-R Amp (%) Site1 Site2 L Vel (m/s) R Vel (m/s) L-R Vel (m/s)  Fibular Motor (Ext Dig Brev)  26.9C  Ankle 5.6 5.8 0.2 *1.4 4.9 *71.4 B Fib Ankle 39 40 1  B Fib 13.6 14.1 0.5 3.7 5.1 27.5 Poplt B Fib 42 41 1  Poplt 16.0 16.3 0.3 3.7 4.8 22.9       Tibial Motor (Abd Hall Brev)  27C  Ankle 5.9 5.2 0.7 3.4 3.6 5.6 Knee Ankle 42 45 3  Knee 15.1 14.4 0.7 0.8 4.9 83.7          Waveforms:                      Clinical History: No specialty comments available.   He reports that he has never smoked. He has never used smokeless tobacco. No results for input(s): "HGBA1C", "LABURIC" in the last 8760 hours.  Objective:  VS:  HT:    WT:   BMI:     BP:   HR: bpm  TEMP: ( )  RESP:  Physical Exam Vitals and nursing note reviewed.  Constitutional:      General: He is not in acute distress.    Appearance: Normal appearance. He is well-developed. He is not ill-appearing.  HENT:     Head: Normocephalic and atraumatic.     Right Ear: External ear normal.     Left Ear: External ear normal.     Nose: No congestion.  Eyes:     Extraocular Movements: Extraocular movements intact.     Conjunctiva/sclera: Conjunctivae normal.     Pupils: Pupils are equal,  round, and reactive to light.  Cardiovascular:     Rate and Rhythm: Normal rate.     Pulses: Normal pulses.     Heart sounds: Normal heart sounds.  Pulmonary:     Effort: Pulmonary effort is normal. No respiratory distress.  Abdominal:     General: There is no distension.     Palpations: Abdomen is soft.  Musculoskeletal:        General: No tenderness or signs of injury.     Cervical back: Normal range of motion and neck supple. No rigidity.     Right lower leg: No edema.     Left lower leg: No edema.     Comments: Patient has good distal strength without clonus.  He has normal muscle tone and bulk of both lower extremities including tibialis anterior and gastrocnemius as well as good intrinsic foot musculature.  He has full 5 out of 5 strength in dorsiflexion plantarflexion EHL and knee flexion extension.  Skin:    General: Skin is warm and dry.     Findings: No erythema or rash.  Neurological:     General: No focal deficit present.     Mental Status: He is alert and oriented to person, place, and time.     Cranial Nerves: No cranial nerve deficit.     Sensory: Sensory deficit present.     Motor: No weakness or abnormal muscle tone.      Coordination: Coordination normal.     Gait: Gait normal.  Psychiatric:        Mood and Affect: Mood normal.        Behavior: Behavior normal.     Ortho Exam  Imaging: No results found.  Past Medical/Family/Surgical/Social History: Medications & Allergies reviewed per EMR, new medications updated. 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   OA (osteoarthritis)    Family History  Problem Relation Age of Onset   Hypertension Mother    Heart attack Father 70   Other Father        open heart surgery   Diabetes Father    Diabetes Brother    Hypertension Maternal Aunt    Heart disease Other        uncles on both sides of famiily   Cancer Neg Hx    Colon cancer Neg Hx    Colon polyps Neg Hx    Crohn's disease Neg Hx    Esophageal cancer Neg Hx    Rectal cancer Neg Hx    Stomach cancer Neg Hx    Ulcerative colitis Neg Hx    Past Surgical History:  Procedure Laterality Date   KNEE SURGERY     multiple   LEFT HEART CATH  2001   ORTHOPEDIC SURGERY     several   Social History   Occupational History   Not on file  Tobacco Use   Smoking status: Never   Smokeless tobacco: Never  Vaping Use   Vaping status: Never Used  Substance and Sexual Activity   Alcohol use: Yes    Comment: wine with dinner   Drug use: No   Sexual activity: Not on file

## 2023-11-07 ENCOUNTER — Other Ambulatory Visit

## 2023-11-08 ENCOUNTER — Other Ambulatory Visit

## 2023-11-14 ENCOUNTER — Other Ambulatory Visit

## 2023-11-14 ENCOUNTER — Ambulatory Visit
Admission: RE | Admit: 2023-11-14 | Discharge: 2023-11-14 | Disposition: A | Discharge status: Home / Self Care | Source: Ambulatory Visit | Attending: Family Medicine | Admitting: Family Medicine

## 2023-11-14 DIAGNOSIS — M5440 Lumbago with sciatica, unspecified side: Secondary | ICD-10-CM

## 2023-11-17 ENCOUNTER — Other Ambulatory Visit: Payer: Self-pay | Admitting: Family Medicine

## 2023-11-17 DIAGNOSIS — G8929 Other chronic pain: Secondary | ICD-10-CM

## 2023-11-23 ENCOUNTER — Other Ambulatory Visit: Payer: Self-pay | Admitting: Family Medicine

## 2023-11-23 DIAGNOSIS — G8929 Other chronic pain: Secondary | ICD-10-CM

## 2023-11-28 ENCOUNTER — Other Ambulatory Visit

## 2023-12-01 ENCOUNTER — Encounter: Payer: Self-pay | Admitting: Orthopedic Surgery

## 2023-12-03 ENCOUNTER — Telehealth: Payer: Self-pay | Admitting: Orthopedic Surgery

## 2023-12-03 NOTE — Telephone Encounter (Signed)
 Received call from patient. He needs copy of all available medical records ([facility]) for the [place]. He will come by and sign authorization.

## 2023-12-05 ENCOUNTER — Ambulatory Visit
Admission: RE | Admit: 2023-12-05 | Discharge: 2023-12-05 | Disposition: A | Discharge status: Home / Self Care | Source: Ambulatory Visit | Attending: Family Medicine | Admitting: Family Medicine

## 2023-12-05 ENCOUNTER — Ambulatory Visit
Admission: RE | Admit: 2023-12-05 | Discharge: 2023-12-05 | Disposition: A | Discharge status: Home / Self Care | Payer: Self-pay | Source: Ambulatory Visit | Attending: Family Medicine | Admitting: Family Medicine

## 2023-12-05 DIAGNOSIS — G8929 Other chronic pain: Secondary | ICD-10-CM

## 2023-12-18 ENCOUNTER — Ambulatory Visit: Admitting: Orthopedic Surgery

## 2024-02-29 ENCOUNTER — Ambulatory Visit: Attending: Cardiovascular Disease | Admitting: Cardiovascular Disease

## 2024-02-29 NOTE — Progress Notes (Deleted)
 No chief complaint on file.     History of Present Illness: 73 yo male with history of TIA, SVT and hyperlipidemia who is here today for follow up. He has been followed in our office by Dr. Claudene and Dr. Court. Cardiac monitor in 2023 with sinus, short runs of SVT and PVCs. He was last seen in our office in 2024. No known CAD. CT calcium  score arranged in 2023 not performed by patient.   Primary Care Physician: Hughie Sharper, MD   Past Medical History:  Diagnosis Date   History of kidney stones    HLD (hyperlipidemia)    Hx-TIA (transient ischemic attack) 2011   right brain with left arm weakness   OA (osteoarthritis)     Past Surgical History:  Procedure Laterality Date   KNEE SURGERY     multiple   LEFT HEART CATH  2001   ORTHOPEDIC SURGERY     several    Current Outpatient Medications  Medication Sig Dispense Refill   aspirin EC 81 MG tablet Take 81 mg by mouth daily. Swallow whole.     Colchicine  (MITIGARE ) 0.6 MG CAPS Take 1 capsule by mouth 2 (two) times daily as needed. (Patient taking differently: Take 1 capsule by mouth daily.) 60 capsule 3   DULoxetine  (CYMBALTA ) 20 MG capsule Take 1 capsule (20 mg total) by mouth daily. 30 capsule 3   Febuxostat  80 MG TABS Take 1 tablet by mouth daily.     ketoconazole (NIZORAL) 2 % shampoo Apply topically every other day.     NITROSTAT 0.4 MG SL tablet Place under the tongue.     rosuvastatin  (CRESTOR ) 20 MG tablet TAKE 1 TABLET (20 MG TOTAL) BY MOUTH DAILY. KEEP SCHEDULED APPOINTMENT FOR FURTHER REFILLS 30 tablet 0   No current facility-administered medications for this visit.    Allergies  Allergen Reactions   Codeine     REACTION: hallucinations    Social History   Socioeconomic History   Marital status: Married    Spouse name: Not on file   Number of children: Not on file   Years of education: Not on file   Highest education level: Not on file  Occupational History   Not on file  Tobacco Use   Smoking  status: Never   Smokeless tobacco: Never  Vaping Use   Vaping status: Never Used  Substance and Sexual Activity   Alcohol use: Yes    Comment: wine with dinner   Drug use: No   Sexual activity: Not on file  Other Topics Concern   Not on file  Social History Narrative   Not on file   Social Drivers of Health   Financial Resource Strain: Not on file  Food Insecurity: Not on file  Transportation Needs: Not on file  Physical Activity: Not on file  Stress: Not on file  Social Connections: Unknown (11/15/2021)   [facility]   Social Network    Social Network: Not on file  Intimate Partner Violence: Unknown (10/07/2021)   Received from [facility]   HITS    Physically Hurt: Not on file    Insult or Talk Down To: Not on file    Threaten Physical Harm: Not on file    Scream or Curse: Not on file    Family History  Problem Relation Age of Onset   Hypertension Mother    Heart attack Father 23   Other Father        open  heart surgery   Diabetes Father    Diabetes Brother    Hypertension Maternal Aunt    Heart disease Other        uncles on both sides of famiily   Cancer Neg Hx    Colon cancer Neg Hx    Colon polyps Neg Hx    Crohn's disease Neg Hx    Esophageal cancer Neg Hx    Rectal cancer Neg Hx    Stomach cancer Neg Hx    Ulcerative colitis Neg Hx     Review of Systems:  As stated in the HPI and otherwise negative.   There were no vitals taken for this visit.  Physical Examination: General: Well developed, well nourished, NAD  HEENT: OP clear, mucus membranes moist  SKIN: warm, dry. No rashes. Neuro: No focal deficits  Musculoskeletal: Muscle strength 5/5 all ext  Psychiatric: Mood and affect normal  Neck: No JVD, no carotid bruits, no thyromegaly, no lymphadenopathy.  Lungs:Clear bilaterally, no wheezes, rhonci, crackles Cardiovascular: Regular rate and rhythm. No murmurs, gallops or rubs. Abdomen:Soft. Bowel sounds present. Non-tender.   Extremities: No lower extremity edema. Pulses are 2 + in the bilateral DP/PT.  EKG:  EKG {ACTION; IS/IS [id number]} ordered today. The ekg ordered today demonstrates ***  Recent Labs: No results found for requested labs within last 365 days.   Lipid Panel    Component Value Date/Time   CHOL 140 09/23/2022 1149   TRIG 104 09/23/2022 1149   HDL 59 09/23/2022 1149   CHOLHDL 2.4 09/23/2022 1149   CHOLHDL 3.7 11/28/2020 1014   LDLCALC 62 09/23/2022 1149   LDLCALC 146 (H) 11/28/2020 1014     Wt Readings from Last 3 Encounters:  09/22/22 200 lb 9.6 oz (91 kg)  09/16/22 196 lb (88.9 kg)  05/09/22 208 lb (94.3 kg)      Assessment and Plan:   1.   Labs/ tests ordered today include:  No orders of the defined types were placed in this encounter.    Disposition:   F/U with me in ***    Signed, Lonni Cash, MD, [facility] 02/29/2024 7:14 AM    [facility] [address] [place], [place], [place]  [address] Phone: [phone]; Fax: [phone]

## 2024-05-09 ENCOUNTER — Encounter: Payer: Self-pay | Admitting: Radiology

## 2024-05-18 ENCOUNTER — Ambulatory Visit
Admission: RE | Admit: 2024-05-18 | Discharge: 2024-05-18 | Disposition: A | Discharge status: Home / Self Care | Source: Ambulatory Visit | Attending: Family Medicine | Admitting: Family Medicine

## 2024-05-18 ENCOUNTER — Other Ambulatory Visit: Payer: Self-pay | Admitting: Family Medicine

## 2024-05-18 DIAGNOSIS — E785 Hyperlipidemia, unspecified: Secondary | ICD-10-CM

## 2024-05-18 DIAGNOSIS — S5012XA Contusion of left forearm, initial encounter: Secondary | ICD-10-CM

## 2024-05-18 DIAGNOSIS — S60212A Contusion of left wrist, initial encounter: Secondary | ICD-10-CM

## 2024-05-25 ENCOUNTER — Other Ambulatory Visit: Payer: Self-pay | Admitting: Family Medicine

## 2024-05-25 DIAGNOSIS — S60212A Contusion of left wrist, initial encounter: Secondary | ICD-10-CM

## 2024-05-26 ENCOUNTER — Other Ambulatory Visit

## 2024-06-14 ENCOUNTER — Inpatient Hospital Stay
Admission: RE | Admit: 2024-06-14 | Discharge: 2024-06-14 | Disposition: A | Source: Ambulatory Visit | Attending: Family Medicine | Admitting: Family Medicine

## 2024-06-14 ENCOUNTER — Ambulatory Visit
Admission: RE | Admit: 2024-06-14 | Discharge: 2024-06-14 | Disposition: A | Source: Ambulatory Visit | Attending: Family Medicine | Admitting: Family Medicine

## 2024-06-14 DIAGNOSIS — S60212A Contusion of left wrist, initial encounter: Secondary | ICD-10-CM

## 2024-07-27 ENCOUNTER — Other Ambulatory Visit

## 2024-07-27 ENCOUNTER — Inpatient Hospital Stay: Admission: RE | Admit: 2024-07-27 | Source: Ambulatory Visit
